# Patient Record
Sex: Female | Born: 1935 | Hispanic: Refuse to answer | Smoking: Never smoker
Health system: Southern US, Community
[De-identification: ages and names within clinical notes are randomized; demographics above are authoritative.]

## PROBLEM LIST (undated history)

## (undated) DIAGNOSIS — F0390 Unspecified dementia without behavioral disturbance: Secondary | ICD-10-CM

## (undated) DIAGNOSIS — S42301A Unspecified fracture of shaft of humerus, right arm, initial encounter for closed fracture: Secondary | ICD-10-CM

## (undated) DIAGNOSIS — Z8781 Personal history of (healed) traumatic fracture: Secondary | ICD-10-CM

## (undated) DIAGNOSIS — J45909 Unspecified asthma, uncomplicated: Secondary | ICD-10-CM

## (undated) DIAGNOSIS — M2031 Hallux varus (acquired), right foot: Secondary | ICD-10-CM

## (undated) DIAGNOSIS — F03A0 Unspecified dementia, mild, without behavioral disturbance, psychotic disturbance, mood disturbance, and anxiety: Secondary | ICD-10-CM

## (undated) DIAGNOSIS — W19XXXA Unspecified fall, initial encounter: Secondary | ICD-10-CM

## (undated) DIAGNOSIS — M4856XA Collapsed vertebra, not elsewhere classified, lumbar region, initial encounter for fracture: Secondary | ICD-10-CM

## (undated) DIAGNOSIS — N189 Chronic kidney disease, unspecified: Secondary | ICD-10-CM

## (undated) DIAGNOSIS — E119 Type 2 diabetes mellitus without complications: Secondary | ICD-10-CM

## (undated) DIAGNOSIS — L97511 Non-pressure chronic ulcer of other part of right foot limited to breakdown of skin: Secondary | ICD-10-CM

## (undated) DIAGNOSIS — R296 Repeated falls: Secondary | ICD-10-CM

## (undated) DIAGNOSIS — E1169 Type 2 diabetes mellitus with other specified complication: Secondary | ICD-10-CM

## (undated) DIAGNOSIS — B351 Tinea unguium: Secondary | ICD-10-CM

## (undated) HISTORY — PX: EYE SURGERY: SHX253

## (undated) HISTORY — PX: BREAST SURGERY: SHX581

## (undated) HISTORY — PX: OTHER SURGICAL HISTORY: SHX169

## (undated) HISTORY — PX: PANCREATIC CYST DRAINAGE: SHX2156

---

## 2020-11-26 ENCOUNTER — Emergency Department: Payer: Medicare Other

## 2020-11-26 ENCOUNTER — Emergency Department
Admission: EM | Admit: 2020-11-26 | Discharge: 2020-11-26 | Disposition: A | Discharge status: Home / Self Care | Payer: Medicare Other | Attending: Emergency Medicine | Admitting: Emergency Medicine

## 2020-11-26 ENCOUNTER — Other Ambulatory Visit: Payer: Self-pay

## 2020-11-26 DIAGNOSIS — W19XXXA Unspecified fall, initial encounter: Secondary | ICD-10-CM | POA: Diagnosis not present

## 2020-11-26 DIAGNOSIS — F0390 Unspecified dementia without behavioral disturbance: Secondary | ICD-10-CM | POA: Diagnosis not present

## 2020-11-26 DIAGNOSIS — Y92129 Unspecified place in nursing home as the place of occurrence of the external cause: Secondary | ICD-10-CM | POA: Insufficient documentation

## 2020-11-26 DIAGNOSIS — S3992XA Unspecified injury of lower back, initial encounter: Secondary | ICD-10-CM | POA: Diagnosis present

## 2020-11-26 DIAGNOSIS — S32040A Wedge compression fracture of fourth lumbar vertebra, initial encounter for closed fracture: Secondary | ICD-10-CM | POA: Diagnosis not present

## 2020-11-26 LAB — URINALYSIS, COMPLETE (UACMP) WITH MICROSCOPIC
Bilirubin Urine: NEGATIVE
Glucose, UA: NEGATIVE mg/dL
Hgb urine dipstick: NEGATIVE
Ketones, ur: NEGATIVE mg/dL
Nitrite: POSITIVE — AB
Protein, ur: NEGATIVE mg/dL
Specific Gravity, Urine: 1.014 (ref 1.005–1.030)
pH: 5 (ref 5.0–8.0)

## 2020-11-26 MED ORDER — OXYCODONE-ACETAMINOPHEN 5-325 MG PO TABS: 1.0000 | ORAL_TABLET | Freq: Four times a day (QID) | ORAL | 0 refills | Status: DC | PRN

## 2020-11-26 MED ORDER — PREDNISONE 50 MG PO TABS: 50.0000 mg | ORAL_TABLET | Freq: Every day | ORAL | 0 refills | Status: DC

## 2020-11-26 MED ORDER — OXYCODONE-ACETAMINOPHEN 5-325 MG PO TABS
1.0000 | ORAL_TABLET | Freq: Once | ORAL | Status: AC
  Administered 2020-11-26: 20:00:00 1 via ORAL
  Filled 2020-11-26: qty 1

## 2020-11-26 NOTE — ED Triage Notes (Signed)
Pt comes with lower back pain off and on for a few days. Lives at moderately independent living at [facility]. Staff unsure if any falls. Pt denies falls. Hx of pelvic fracture.

## 2020-11-26 NOTE — ED Provider Notes (Signed)
[facility] Emergency Department Provider Note  ____________________________________________  Time seen: Approximately 7:14 PM  I have reviewed the triage vital signs and the nursing notes.   HISTORY  Chief Complaint Back Pain    HPI Linda Yu is a 86 y.o. female who presents the emergency department with her daughter for complaint of nontraumatic hip/flank/back pain.  Patient has been complaining of symptoms for at least 3 days.  The daughter reports that the patient does have a history of dementia, stays in a long-term care facility in the independent section.  Patient has difficulty ambulating without help due to her pain.  She denied any dysuria, polyuria or hematuria.  The daughter denies any reported fevers, URI symptoms.  No reported trauma, however the daughter states with her dementia she definitely could have fallen, got herself out of the floor and nobody would her know that she had fallen.  A similar episode earlier this year had occurred when she complained of hip pain and was evaluated and had a pelvic fracture.  Patient is still able to ambulate with assistance at this time with no discernible limp.  No other appreciable complaints currently.         History reviewed. No pertinent past medical history.  There are no problems to display for this patient.     Prior to Admission medications   Medication Sig Start Date End Date Taking? Authorizing Provider  oxyCODONE-acetaminophen (PERCOCET/ROXICET) 5-325 MG tablet Take 1 tablet by mouth every 6 (six) hours as needed for severe pain. 11/26/20  Yes Adilee Lemme, Delorise Royals, PA-C  predniSONE (DELTASONE) 50 MG tablet Take 1 tablet (50 mg total) by mouth daily with breakfast. 11/26/20  Yes Margean Korell, Delorise Royals, PA-C    Allergies Patient has no known allergies.  History reviewed. No pertinent family history.  Social History     Review of Systems  Constitutional: No fever/chills Eyes: No  visual changes. No discharge ENT: No upper respiratory complaints. Cardiovascular: no chest pain. Respiratory: no cough. No SOB. Gastrointestinal: No abdominal pain.  No nausea, no vomiting.  No diarrhea.  No constipation. Genitourinary: Negative for dysuria. No hematuria Musculoskeletal: Positive for lower back/flank/hip pain, primarily on the right Skin: Negative for rash, abrasions, lacerations, ecchymosis. Neurological: Negative for headaches, focal weakness or numbness.  10 System ROS otherwise negative.  ____________________________________________   PHYSICAL EXAM:  VITAL SIGNS: ED Triage Vitals [11/26/20 1647]  Enc Vitals Group     BP (!) 149/81     Pulse Rate 92     Resp 18     Temp 99.1 F (37.3 C)     Temp Source Oral     SpO2 97 %     Weight 128 lb (58.1 kg)     Height 5\' 2"  (1.575 m)     Head Circumference      Peak Flow      Pain Score 5     Pain Loc      Pain Edu?      Excl. in GC?      Constitutional: Alert and oriented. Well appearing and in no acute distress. Eyes: Conjunctivae are normal. PERRL. EOMI. Head: Atraumatic. ENT:      Ears:       Nose: No congestion/rhinnorhea.      Mouth/Throat: Mucous membranes are moist.  Neck: No stridor.    Cardiovascular: Normal rate, regular rhythm. Normal S1 and S2.  Good peripheral circulation. Respiratory: Normal respiratory effort without tachypnea or retractions. Lungs CTAB. Good  air entry to the bases with no decreased or absent breath sounds. Gastrointestinal: Bowel sounds 4 quadrants. Soft and nontender to palpation. No guarding or rigidity. No palpable masses. No distention. No CVA tenderness. Musculoskeletal: Full range of motion to all extremities. No gross deformities appreciated.  Visualization of the lumbar spine reveals no visible signs of trauma with abrasions, ecchymosis, edema, lacerations.  Patient is tender to palpation along L3-L5 with extension into the SI joint.  Mild extension into the  sciatic notch right side but no extension into the lateral or anterior hip and pelvis.  No significant tenderness on the left SI joint or left pelvis.  Patient is able to move both lower extremities appropriately, stand at this time.  There is no tenderness over the extremities.  No visible or palpable signs of trauma to the lower extremities.  Dorsalis pedis pulses sensation intact and equal bilateral lower extremities.  On reentering the room, patient's daughter asked me to evaluate the patient's right foot.  Patient has a diabetic foot ulcer that is to be seen next week by podiatry.  Daughter wanted to ensure no infection.  There is a edematous lesion along the medial aspect of the second toe of the right foot.  No surrounding erythema or edema.  No purulent drainage.  Area is tender to palpation.  Consistent with foot ulcer without signs of infection.  No streaking, erythema along the foot. Neurologic:  Normal speech and language. No gross focal neurologic deficits are appreciated.  Skin:  Skin is warm, dry and intact. No rash noted. Psychiatric: Mood and affect are normal. Speech and behavior are normal. Patient exhibits appropriate insight and judgement.   ____________________________________________   LABS (all labs ordered are listed, but only abnormal results are displayed)  Labs Reviewed  URINALYSIS, COMPLETE (UACMP) WITH MICROSCOPIC - Abnormal; Notable for the following components:      Result Value   Color, Urine YELLOW (*)    APPearance CLOUDY (*)    Nitrite POSITIVE (*)    Leukocytes,Ua SMALL (*)    Bacteria, UA MANY (*)    All other components within normal limits   ____________________________________________  EKG   ____________________________________________  RADIOLOGY I personally viewed and evaluated these images as part of my medical decision making, as well as reviewing the written report by the radiologist.  ED Provider Interpretation: I concur with radiologist  finding of endplate deformity of L4 possibly L2 and 3 as well consistent with compression fracture.  Less than a third height of the vertebrae.  No other significant signs of traumatic injury in the lumbar, pelvis or hips on imaging.  All fractures of the superior and inferior rami on the left side are appreciated.  DG Lumbar Spine 2-3 Views  Result Date: 11/26/2020 CLINICAL DATA:  Nontraumatic back and pelvic pain. EXAM: LUMBAR SPINE - 2-3 VIEW COMPARISON:  None. FINDINGS: Mild thoracolumbar curvature convex to the left. Transitional anatomy with L5 being sacralized. Superior endplate deformity at L4 consistent with a compression fracture of indeterminate age, possibly recent. There could also be minor inferior endplate deformity at L2 and superior endplate deformity at L3. There is lower lumbar degenerative disc disease and degenerative facet disease, including anterolisthesis at L4-5 of about 5 mm. There is extensive arterial vascular calcification. Calcifications in the right mid abdomen could be venous, gallstones or renal calculi. IMPRESSION: 1. Transitional anatomy with sacralization of L5. 2. Superior endplate deformity at L4 consistent with a compression fracture of indeterminate age, possibly recent. Some possibility  also minor endplate fractures at L2 and L3. 3. Lower lumbar degenerative disc disease and degenerative facet disease that could be associated with pain. 4. Calcifications in the right mid abdomen could be venous, gallstones or renal calculi. Electronically Signed   By: Paulina FusiMark  Shogry M.D.   On: 11/26/2020 20:02   DG Hip Unilat W or Wo Pelvis 2-3 Views Left  Result Date: 11/26/2020 CLINICAL DATA:  Back pain and hip pain EXAM: DG HIP (WITH OR WITHOUT PELVIS) 2-3V LEFT COMPARISON:  None. FINDINGS: No acute fracture. Old healed superior and inferior rami fractures on the left. 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 IMPRESSION: No acute finding. Old healed superior  and inferior rami fractures on the left. Osteitis pubis and sacroiliac osteoarthritis. Electronically Signed   By: Paulina FusiMark  Shogry M.D.   On: 11/26/2020 20:04   DG Hip Unilat W or Wo Pelvis 2-3 Views Right  Result Date: 11/26/2020 CLINICAL DATA:  Intermittent low back pain over the last few days. Hip discomfort. EXAM: DG HIP (WITH OR WITHOUT PELVIS) 2-3V RIGHT COMPARISON:  None. FINDINGS: No evidence of pelvic or right hip fracture. Ordinary chronic changes of osteitis pubis. Mild sacroiliac osteoarthritis. IMPRESSION: No acute finding. 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 Electronically Signed   By: Paulina FusiMark  Shogry M.D.   On: 11/26/2020 20:03    ____________________________________________    PROCEDURES  Procedure(s) performed:    Procedures    Medications  oxyCODONE-acetaminophen (PERCOCET/ROXICET) 5-325 MG per tablet 1 tablet (1 tablet Oral Given 11/26/20 2022)     ____________________________________________   INITIAL IMPRESSION / ASSESSMENT AND PLAN / ED COURSE  Pertinent labs & imaging results that were available during my care of the patient were reviewed by me and considered in my medical decision making (see chart for details).  Review of the [place] CSRS was performed in accordance of the [facility] prior to dispensing any controlled drugs.           Patient's diagnosis is consistent with compression fracture lumbar spine.  Patient presented to the emergency department with low back pain.  History of dementia so unsure whether the patient may have had a fall.  There was no reported fall from the patient's long-term care facility.  Patient was tender over the L4 and L5 region extending into the SI joint.  Given poor historian status imaging was obtained to assess for fracture.  There is findings consistent with compression fracture of the L4 vertebrae.  This does not extend past the third of the vertebral height.  No neuro symptoms distally to warrant further  imaging.  No other significant signs of trauma on imaging.  Patient has an incidental finding of diabetic foot ulcer to the right foot with no surrounding signs of infection.  Patient is already scheduled to see podiatry for same.. Patient will be discharged home with prescriptions for Percocet and prednisone. Patient's family will control the patient's narcotic to ensure that she takes it appropriately and has somebody with her if she takes a narcotic pain medicine.. Patient is to follow up with primary care neurosurgery as needed or otherwise directed. Patient is given ED precautions to return to the ED for any worsening or new symptoms.     ____________________________________________  FINAL CLINICAL IMPRESSION(S) / ED DIAGNOSES  Final diagnoses:  Closed compression fracture of L4 lumbar vertebra, initial encounter (HCC)      NEW MEDICATIONS STARTED DURING THIS VISIT:  ED Discharge Orders  Ordered    oxyCODONE-acetaminophen (PERCOCET/ROXICET) 5-325 MG tablet  Every 6 hours PRN        11/26/20 2109    predniSONE (DELTASONE) 50 MG tablet  Daily with breakfast        11/26/20 2109              This chart was dictated using voice recognition software/Dragon. Despite best efforts to proofread, errors can occur which can change the meaning. Any change was purely unintentional.    Lanette Hampshire 11/26/20 2110    Phineas Semen, MD 11/26/20 2146

## 2020-11-26 NOTE — Discharge Instructions (Signed)
Patient may receive 650 mg of Tylenol every 4 hours as needed for pain. Take daily steroid. Narcotic pain medication will be administered by family as needed for severe pain.

## 2021-01-17 ENCOUNTER — Other Ambulatory Visit: Payer: Self-pay | Admitting: Podiatry

## 2021-01-23 ENCOUNTER — Other Ambulatory Visit
Admission: RE | Admit: 2021-01-23 | Discharge: 2021-01-23 | Disposition: A | Discharge status: Home / Self Care | Payer: Medicare Other | Source: Ambulatory Visit | Attending: Surgery | Admitting: Surgery

## 2021-01-23 ENCOUNTER — Other Ambulatory Visit: Payer: Self-pay

## 2021-01-23 ENCOUNTER — Other Ambulatory Visit
Admission: RE | Admit: 2021-01-23 | Discharge: 2021-01-23 | Disposition: A | Discharge status: Home / Self Care | Payer: Medicare Other | Source: Ambulatory Visit | Attending: Podiatry | Admitting: Podiatry

## 2021-01-23 DIAGNOSIS — Z01812 Encounter for preprocedural laboratory examination: Secondary | ICD-10-CM | POA: Diagnosis present

## 2021-01-23 DIAGNOSIS — Z20822 Contact with and (suspected) exposure to covid-19: Secondary | ICD-10-CM | POA: Diagnosis not present

## 2021-01-23 HISTORY — DX: Unspecified dementia, mild, without behavioral disturbance, psychotic disturbance, mood disturbance, and anxiety: F03.A0

## 2021-01-23 HISTORY — DX: Chronic kidney disease, unspecified: N18.9

## 2021-01-23 HISTORY — DX: Hallux varus (acquired), right foot: M20.31

## 2021-01-23 HISTORY — DX: Personal history of (healed) traumatic fracture: Z87.81

## 2021-01-23 HISTORY — DX: Repeated falls: R29.6

## 2021-01-23 HISTORY — DX: Unspecified fracture of shaft of humerus, right arm, initial encounter for closed fracture: S42.301A

## 2021-01-23 HISTORY — DX: Non-pressure chronic ulcer of other part of right foot limited to breakdown of skin: L97.511

## 2021-01-23 HISTORY — DX: Collapsed vertebra, not elsewhere classified, lumbar region, initial encounter for fracture: M48.56XA

## 2021-01-23 HISTORY — DX: Tinea unguium: B35.1

## 2021-01-23 HISTORY — DX: Type 2 diabetes mellitus without complications: E11.9

## 2021-01-23 HISTORY — DX: Unspecified asthma, uncomplicated: J45.909

## 2021-01-23 HISTORY — DX: Type 2 diabetes mellitus with other specified complication: E11.69

## 2021-01-23 HISTORY — DX: Unspecified dementia without behavioral disturbance: F03.90

## 2021-01-23 HISTORY — DX: Unspecified fall, initial encounter: W19.XXXA

## 2021-01-23 LAB — SARS CORONAVIRUS 2 (TAT 6-24 HRS): SARS Coronavirus 2: NEGATIVE

## 2021-01-23 NOTE — Patient Instructions (Addendum)
Your procedure is scheduled on: 01/24/2021 Report to the Registration Desk on the 1st floor of the [facility]. To find out your arrival time, please call [phone] between 1PM - 3PM on: 01/23/21  REMEMBER: Instructions that are not followed completely may result in serious medical risk, up to and including death; or upon the discretion of your surgeon and anesthesiologist your surgery may need to be rescheduled.  Do not eat food after midnight the night before surgery.  No gum chewing, lozengers or hard candies.  You may however, drink CLEAR liquids up to 3 hours before you are scheduled to arrive for your surgery. Do not drink anything within 3 hours of your scheduled arrival time, Type 1 and Type 2 diabetics should only drink water.  TAKE THESE MEDICATIONS THE MORNING OF SURGERY WITH A SIP OF WATER:  - albuterol (PROVENTIL) (2.5 MG/3ML) 0.083% nebulizer solution - Fluticasone-Salmeterol (ADVAIR) 100-50 MCG/DOSE AEPB - montelukast (SINGULAIR) 10 MG tablet  Stop Metformin 2 days prior to surgery. Do not take 2/24, or the morning of surgery.  Take 1/2 of usual insulin glargine (LANTUS) 100 UNIT/ML injection dose the night before surgery   Follow recommendations from Cardiologist, Pulmonologist or PCP regarding stopping Aspirin, Coumadin, Plavix, Eliquis, Pradaxa, or Pletal.  One week prior to surgery: Stop Anti-inflammatories (NSAIDS) such as Advil, Aleve, Ibuprofen, Motrin, Naproxen, Naprosyn and Aspirin based products such as Excedrin, Goodys Powder, BC Powder. Stop ANY OVER THE COUNTER supplements until after surgery.  No Alcohol for 24 hours before or after surgery.  No Smoking including e-cigarettes for 24 hours prior to surgery.  No chewable tobacco products for at least 6 hours prior to surgery.  No nicotine patches on the day of surgery.  Do not use any "recreational" drugs for at least a week prior to your surgery.  Please be advised that the combination of cocaine  and anesthesia may have negative outcomes, up to and including death. If you test positive for cocaine, your surgery will be cancelled.  On the morning of surgery brush your teeth with toothpaste and water, you may rinse your mouth with mouthwash if you wish. Do not swallow any toothpaste or mouthwash.  Do not wear jewelry, make-up, hairpins, clips or nail polish.  Do not wear lotions, powders, or perfumes.   Do not shave body from the neck down 48 hours prior to surgery just in case you cut yourself which could leave a site for infection.  Also, freshly shaved skin may become irritated if using the CHG soap.  Contact lenses, hearing aids and dentures may not be worn into surgery.  Do not bring valuables to the hospital. [facility] is not responsible for any missing/lost belongings or valuables.   Notify your doctor if there is any change in your medical condition (cold, fever, infection).  You must have a competent adult to stay with you for the 1st 24 hrs after procedure.  Wear comfortable clothing (specific to your surgery type) to the hospital.  Plan for stool softeners for home use; pain medications have a tendency to cause constipation. You can also help prevent constipation by eating foods high in fiber such as fruits and vegetables and drinking plenty of fluids as your diet allows.  After surgery, you can help prevent lung complications by doing breathing exercises.  Take deep breaths and cough every 1-2 hours. Your doctor may order a device called an Incentive Spirometer to help you take deep breaths. When coughing or sneezing, hold a pillow  firmly against your incision with both hands. This is called "splinting." Doing this helps protect your incision. It also decreases belly discomfort.  If you are being admitted to the hospital overnight, leave your suitcase in the car. After surgery it may be brought to your room.  If you are being discharged the day of surgery, you  will not be allowed to drive home. You will need a responsible adult (18 years or older) to drive you home and stay with you that night.   If you are taking public transportation, you will need to have a responsible adult (18 years or older) with you. Please confirm with your physician that it is acceptable to use public transportation.   Please call the Pre-admissions Testing Dept. at [phone] if you have any questions about these instructions.  Visitation Policy:  Patients undergoing a surgery or procedure may have one family member or support person with them as long as that person is not COVID-19 positive or experiencing its symptoms.  That person may remain in the waiting area during the procedure.  Inpatient Visitation:    Visiting hours are 7 a.m. to 8 p.m. Patients will be allowed one visitor. The visitor may change daily. The visitor must pass COVID-19 screenings, use hand sanitizer when entering and exiting the patient's room and wear a mask at all times, including in the patient's room. Patients must also wear a mask when staff or their visitor are in the room. Masking is required regardless of vaccination status. Systemwide, no visitors 17 or younger.  Visitation Policy Changes: The following changes are to take effect on Feb. 28 at 7 a.m.  No visitors under the age of 59. Any visitor under the age of 63 must be accompanied by an adult. Adult inpatients: Two visitors will be allowed daily and the visitors may change each day during the patient's stay. (Please note -- no changes at this time for the [facility], Children's Emergency Department and inpatients, Emergency Departments, Ambulatory Sites, [facility], medical practices and procedural areas.)

## 2021-01-23 NOTE — Pre-Procedure Instructions (Signed)
Interview and instructions completed with Irene Shipper , legal guardian of patient, Phillips Odor will bring legal guardianship papers to hospital in the morning before surgery.

## 2021-01-24 ENCOUNTER — Other Ambulatory Visit: Payer: Self-pay

## 2021-01-24 ENCOUNTER — Encounter: Payer: Self-pay | Admitting: Podiatry

## 2021-01-24 ENCOUNTER — Ambulatory Visit: Payer: Medicare Other | Admitting: Urgent Care

## 2021-01-24 ENCOUNTER — Ambulatory Visit: Payer: Medicare Other

## 2021-01-24 ENCOUNTER — Ambulatory Visit: Payer: Medicare Other | Admitting: Anesthesiology

## 2021-01-24 ENCOUNTER — Encounter
Admission: RE | Disposition: A | Discharge status: Home / Self Care | Payer: Self-pay | Source: Home / Self Care | Attending: Podiatry

## 2021-01-24 ENCOUNTER — Ambulatory Visit
Admission: RE | Admit: 2021-01-24 | Discharge: 2021-01-24 | Disposition: A | Discharge status: Home / Self Care | Payer: Medicare Other | Attending: Podiatry | Admitting: Podiatry

## 2021-01-24 DIAGNOSIS — E11621 Type 2 diabetes mellitus with foot ulcer: Secondary | ICD-10-CM | POA: Diagnosis not present

## 2021-01-24 DIAGNOSIS — Z7984 Long term (current) use of oral hypoglycemic drugs: Secondary | ICD-10-CM | POA: Insufficient documentation

## 2021-01-24 DIAGNOSIS — B351 Tinea unguium: Secondary | ICD-10-CM | POA: Insufficient documentation

## 2021-01-24 DIAGNOSIS — M2041 Other hammer toe(s) (acquired), right foot: Secondary | ICD-10-CM | POA: Diagnosis present

## 2021-01-24 DIAGNOSIS — M899 Disorder of bone, unspecified: Secondary | ICD-10-CM | POA: Insufficient documentation

## 2021-01-24 DIAGNOSIS — Z794 Long term (current) use of insulin: Secondary | ICD-10-CM | POA: Insufficient documentation

## 2021-01-24 DIAGNOSIS — L97511 Non-pressure chronic ulcer of other part of right foot limited to breakdown of skin: Secondary | ICD-10-CM | POA: Diagnosis not present

## 2021-01-24 HISTORY — PX: HAMMER TOE SURGERY: SHX385

## 2021-01-24 HISTORY — PX: BONE EXCISION: SHX6730

## 2021-01-24 HISTORY — PX: DIGIT NAIL REMOVAL: SHX5052

## 2021-01-24 LAB — COMPREHENSIVE METABOLIC PANEL
ALT: 16 U/L (ref 0–44)
AST: 21 U/L (ref 15–41)
Albumin: 3.8 g/dL (ref 3.5–5.0)
Alkaline Phosphatase: 96 U/L (ref 38–126)
Anion gap: 7 (ref 5–15)
BUN: 12 mg/dL (ref 8–23)
CO2: 27 mmol/L (ref 22–32)
Calcium: 9.1 mg/dL (ref 8.9–10.3)
Chloride: 106 mmol/L (ref 98–111)
Creatinine, Ser: 0.55 mg/dL (ref 0.44–1.00)
GFR, Estimated: >60 mL/min (ref >60)
Glucose, Bld: 149 mg/dL — ABNORMAL HIGH (ref 70–99)
Potassium: 4.1 mmol/L (ref 3.5–5.1)
Sodium: 140 mmol/L (ref 135–145)
Total Bilirubin: 0.6 mg/dL (ref 0.3–1.2)
Total Protein: 7.3 g/dL (ref 6.5–8.1)

## 2021-01-24 LAB — GLUCOSE, CAPILLARY
Glucose-Capillary: 146 mg/dL — ABNORMAL HIGH (ref 70–99)
Glucose-Capillary: 147 mg/dL — ABNORMAL HIGH (ref 70–99)

## 2021-01-24 SURGERY — BONE EXCISION
Anesthesia: Monitor Anesthesia Care | Site: Second Toe | Laterality: Right

## 2021-01-24 MED ORDER — LIDOCAINE HCL (PF) 1 % IJ SOLN
INTRAMUSCULAR | Status: DC | PRN
  Administered 2021-01-24: 5 mL

## 2021-01-24 MED ORDER — POVIDONE-IODINE 7.5 % EX SOLN
Freq: Once | CUTANEOUS | Status: AC
  Filled 2021-01-24: qty 118

## 2021-01-24 MED ORDER — ONDANSETRON HCL 4 MG PO TABS: 4.0000 mg | ORAL_TABLET | Freq: Four times a day (QID) | ORAL | Status: DC | PRN

## 2021-01-24 MED ORDER — DEXMEDETOMIDINE (PRECEDEX) IN NS 20 MCG/5ML (4 MCG/ML) IV SYRINGE
PREFILLED_SYRINGE | INTRAVENOUS | Status: DC | PRN
  Administered 2021-01-24: 4 ug via INTRAVENOUS

## 2021-01-24 MED ORDER — PROPOFOL 500 MG/50ML IV EMUL
INTRAVENOUS | Status: DC | PRN
  Administered 2021-01-24: 25 ug/kg/min via INTRAVENOUS
  Administered 2021-01-24: 35 ug/kg/min via INTRAVENOUS

## 2021-01-24 MED ORDER — CHLORHEXIDINE GLUCONATE 0.12 % MT SOLN: 15.0000 mL | Freq: Once | OROMUCOSAL | Status: AC

## 2021-01-24 MED ORDER — ONDANSETRON HCL 4 MG/2ML IJ SOLN: 4.0000 mg | Freq: Once | INTRAMUSCULAR | Status: DC | PRN

## 2021-01-24 MED ORDER — FENTANYL CITRATE (PF) 100 MCG/2ML IJ SOLN
INTRAMUSCULAR | Status: AC
  Filled 2021-01-24: qty 2

## 2021-01-24 MED ORDER — HYDROCODONE-ACETAMINOPHEN 5-325 MG PO TABS: 1.0000 | ORAL_TABLET | Freq: Four times a day (QID) | ORAL | 0 refills | Status: AC | PRN

## 2021-01-24 MED ORDER — METOCLOPRAMIDE HCL 5 MG/ML IJ SOLN: 5.0000 mg | Freq: Three times a day (TID) | INTRAMUSCULAR | Status: DC | PRN

## 2021-01-24 MED ORDER — BUPIVACAINE HCL (PF) 0.25 % IJ SOLN
INTRAMUSCULAR | Status: AC
  Filled 2021-01-24: qty 30

## 2021-01-24 MED ORDER — PROPOFOL 10 MG/ML IV BOLUS
INTRAVENOUS | Status: AC
  Filled 2021-01-24: qty 40

## 2021-01-24 MED ORDER — DEXMEDETOMIDINE (PRECEDEX) IN NS 20 MCG/5ML (4 MCG/ML) IV SYRINGE
PREFILLED_SYRINGE | INTRAVENOUS | Status: AC
  Filled 2021-01-24: qty 5

## 2021-01-24 MED ORDER — CEFAZOLIN SODIUM-DEXTROSE 2-4 GM/100ML-% IV SOLN
2.0000 g | INTRAVENOUS | Status: AC
  Administered 2021-01-24 (×2): 2 g via INTRAVENOUS

## 2021-01-24 MED ORDER — FENTANYL CITRATE (PF) 100 MCG/2ML IJ SOLN
25.0000 ug | INTRAMUSCULAR | Status: DC | PRN
Start: 2021-01-24 — End: 2021-01-24

## 2021-01-24 MED ORDER — BUPIVACAINE HCL (PF) 0.5 % IJ SOLN
INTRAMUSCULAR | Status: AC
  Filled 2021-01-24: qty 30

## 2021-01-24 MED ORDER — FENTANYL CITRATE (PF) 100 MCG/2ML IJ SOLN
INTRAMUSCULAR | Status: DC | PRN
  Administered 2021-01-24: 50 ug via INTRAVENOUS

## 2021-01-24 MED ORDER — CHLORHEXIDINE GLUCONATE 0.12 % MT SOLN
OROMUCOSAL | Status: AC
  Administered 2021-01-24: 15 mL via OROMUCOSAL
  Filled 2021-01-24: qty 15

## 2021-01-24 MED ORDER — ONDANSETRON HCL 4 MG/2ML IJ SOLN: 4.0000 mg | Freq: Four times a day (QID) | INTRAMUSCULAR | Status: DC | PRN

## 2021-01-24 MED ORDER — ORAL CARE MOUTH RINSE: 15.0000 mL | Freq: Once | OROMUCOSAL | Status: AC

## 2021-01-24 MED ORDER — METOCLOPRAMIDE HCL 10 MG PO TABS: 5.0000 mg | ORAL_TABLET | Freq: Three times a day (TID) | ORAL | Status: DC | PRN

## 2021-01-24 MED ORDER — BUPIVACAINE HCL (PF) 0.5 % IJ SOLN
INTRAMUSCULAR | Status: DC | PRN
  Administered 2021-01-24: 5 mL
  Administered 2021-01-24: 10 mL

## 2021-01-24 MED ORDER — SODIUM CHLORIDE 0.9 % IV SOLN: INTRAVENOUS | Status: DC

## 2021-01-24 MED ORDER — CEFAZOLIN SODIUM-DEXTROSE 2-4 GM/100ML-% IV SOLN
INTRAVENOUS | Status: AC
  Filled 2021-01-24: qty 100

## 2021-01-24 MED ORDER — MEPERIDINE HCL 50 MG/ML IJ SOLN: 6.2500 mg | INTRAMUSCULAR | Status: DC | PRN

## 2021-01-24 MED ORDER — PROPOFOL 10 MG/ML IV BOLUS
INTRAVENOUS | Status: AC
  Filled 2021-01-24: qty 20

## 2021-01-24 SURGICAL SUPPLY — 54 items
APL SKNCLS STERI-STRIP NONHPOA (GAUZE/BANDAGES/DRESSINGS) ×2
BENZOIN TINCTURE PRP APPL 2/3 (GAUZE/BANDAGES/DRESSINGS) ×3 IMPLANT
BLADE MED AGGRESSIVE (BLADE) IMPLANT
BLADE OSC/SAGITTAL MD 5.5X18 (BLADE) IMPLANT
BLADE SURG 15 STRL LF DISP TIS (BLADE) IMPLANT
BLADE SURG 15 STRL SS (BLADE)
BNDG CMPR 75X41 PLY HI ABS (GAUZE/BANDAGES/DRESSINGS) ×2
BNDG CMPR STD VLCR NS LF 5.8X4 (GAUZE/BANDAGES/DRESSINGS) ×2
BNDG COHESIVE 4X5 TAN STRL (GAUZE/BANDAGES/DRESSINGS) ×3 IMPLANT
BNDG ELASTIC 4X5.8 VLCR NS LF (GAUZE/BANDAGES/DRESSINGS) ×3 IMPLANT
BNDG ESMARK 4X12 TAN STRL LF (GAUZE/BANDAGES/DRESSINGS) ×3 IMPLANT
BNDG GAUZE 4.5X4.1 6PLY STRL (MISCELLANEOUS) ×3 IMPLANT
BNDG STRETCH 4X75 STRL LF (GAUZE/BANDAGES/DRESSINGS) ×3 IMPLANT
CANISTER SUCT 1200ML W/VALVE (MISCELLANEOUS) ×3 IMPLANT
COVER LIGHT HANDLE STERIS (MISCELLANEOUS) ×6 IMPLANT
COVER WAND RF STERILE (DRAPES) ×3 IMPLANT
CUFF TOURN SGL QUICK 12 (TOURNIQUET CUFF) ×1 IMPLANT
CUFF TOURN SGL QUICK 18X4 (TOURNIQUET CUFF) ×2 IMPLANT
DRAPE FLUOR MINI C-ARM 54X84 (DRAPES) ×3 IMPLANT
DURAPREP 26ML APPLICATOR (WOUND CARE) ×3 IMPLANT
ELECT REM PT RETURN 9FT ADLT (ELECTROSURGICAL) ×3
ELECTRODE REM PT RTRN 9FT ADLT (ELECTROSURGICAL) ×2 IMPLANT
GAUZE SPONGE 4X4 12PLY STRL (GAUZE/BANDAGES/DRESSINGS) ×3 IMPLANT
GAUZE XEROFORM 1X8 LF (GAUZE/BANDAGES/DRESSINGS) ×3 IMPLANT
GLOVE INDICATOR 8.0 STRL GRN (GLOVE) ×3 IMPLANT
GLOVE SURG ENC MOIS LTX SZ7.5 (GLOVE) ×3 IMPLANT
GOWN STRL REUS W/ TWL XL LVL3 (GOWN DISPOSABLE) ×4 IMPLANT
GOWN STRL REUS W/TWL XL LVL3 (GOWN DISPOSABLE) ×6
K-WIRE DBL END TROCAR 6X.045 (WIRE)
K-WIRE DBL END TROCAR 6X.062 (WIRE)
KIT TURNOVER KIT A (KITS) ×3 IMPLANT
KWIRE DBL END TROCAR 6X.045 (WIRE) IMPLANT
KWIRE DBL END TROCAR 6X.062 (WIRE) IMPLANT
MANIFOLD NEPTUNE II (INSTRUMENTS) ×3 IMPLANT
NEEDLE HYPO 22GX1.5 SAFETY (NEEDLE) ×3 IMPLANT
NS IRRIG 500ML POUR BTL (IV SOLUTION) ×3 IMPLANT
PACK EXTREMITY ARMC (MISCELLANEOUS) ×3 IMPLANT
PIN BALLS 3/8 F/.045 WIRE (MISCELLANEOUS) ×3 IMPLANT
RASP SM TEAR CROSS CUT (RASP) ×1 IMPLANT
STOCKINETTE IMPERV 14X48 (MISCELLANEOUS) ×3 IMPLANT
STRAP SAFETY 5IN WIDE (MISCELLANEOUS) ×3 IMPLANT
STRIP CLOSURE SKIN 1/4X4 (GAUZE/BANDAGES/DRESSINGS) ×3 IMPLANT
SUT ETHILON 4-0 (SUTURE)
SUT ETHILON 4-0 FS2 18XMFL BLK (SUTURE)
SUT ETHILON 5-0 FS-2 18 BLK (SUTURE) IMPLANT
SUT MNCRL 5-0+ PC-1 (SUTURE) IMPLANT
SUT MONOCRYL 5-0 (SUTURE)
SUT VIC AB 2-0 SH 27 (SUTURE)
SUT VIC AB 2-0 SH 27XBRD (SUTURE) IMPLANT
SUT VIC AB 3-0 SH 27 (SUTURE)
SUT VIC AB 3-0 SH 27X BRD (SUTURE) IMPLANT
SUT VIC AB 4-0 FS2 27 (SUTURE) ×1 IMPLANT
SUTURE ETHLN 4-0 FS2 18XMF BLK (SUTURE) IMPLANT
SYR 50ML LL SCALE MARK (SYRINGE) ×3 IMPLANT

## 2021-01-24 NOTE — Transfer of Care (Signed)
Immediate Anesthesia Transfer of Care Note  Patient: Linda Yu  Procedure(s) Performed: PARTIAL EXCISION BONE-PHALANX RIGHT GREAT TOE (Right First Toe) HAMMER TOE CORRECTION RIGHT 2ND TOE (Right Second Toe) AVULSION NAIL PLATE RIGHT T5  GREAT TOE (Right First Toe)  Patient Location: PACU  Anesthesia Type:General  Level of Consciousness: drowsy  Airway & Oxygen Therapy: Patient Spontanous Breathing  Post-op Assessment: Report given to RN and Post -op Vital signs reviewed and stable  Post vital signs: Reviewed and stable  Last Vitals:  Vitals Value Taken Time  BP 142/81 01/24/21 1122  Temp    Pulse 83 01/24/21 1125  Resp 24 01/24/21 1125  SpO2 99 % 01/24/21 1125  Vitals shown include unvalidated device data.  Last Pain:  Vitals:   01/24/21 0946  TempSrc: Temporal         Complications: No complications documented.

## 2021-01-24 NOTE — Discharge Instructions (Signed)
AMBULATORY SURGERY  °DISCHARGE INSTRUCTIONS ° ° °1) The drugs that you were given will stay in your system until tomorrow so for the next 24 hours you should not: ° °A) Drive an automobile °B) Make any legal decisions °C) Drink any alcoholic beverage ° ° °2) You may resume regular meals tomorrow.  Today it is better to start with liquids and gradually work up to solid foods. ° °You may eat anything you prefer, but it is better to start with liquids, then soup and crackers, and gradually work up to solid foods. ° ° °3) Please notify your doctor immediately if you have any unusual bleeding, trouble breathing, redness and pain at the surgery site, drainage, fever, or pain not relieved by medication. °4)  ° °5) Your post-operative visit with Dr.                     °           °     is: Date:                        Time:   ° °Please call to schedule your post-operative visit. ° °6) Additional Instructions: ° ° ° ° °[facility] °[facility] ° °POST OPERATIVE INSTRUCTIONS FOR DR. TROXLER, DR. FOWLER, AND DR. BAKER °[facility] ° ° °1. Take your medication as prescribed.  Pain medication should be taken only as needed. ° °2. Keep the dressing clean, dry and intact. ° °3. Keep your foot elevated above the heart level for the first 48 hours. ° °4. Walking to the bathroom and brief periods of walking are acceptable, unless we have instructed you to be non-weight bearing. ° °5. Always wear your post-op shoe when walking.  Always use your crutches if you are to be non-weight bearing. ° °6. Do not take a shower. Baths are permissible as long as the foot is kept out of the water.  ° °7. Every hour you are awake:  °- Bend your knee 15 times. °- Flex foot 15 times °- Massage calf 15 times ° °8. Call [facility] ([phone]) if any of the following problems occur: °- You develop a temperature or fever. °- The bandage becomes saturated with blood. °- Medication does not  stop your pain. °- Injury of the foot occurs. °- Any symptoms of infection including redness, odor, or red streaks running from wound. ° °

## 2021-01-24 NOTE — Op Note (Signed)
Operative note   Surgeon:Garvis Downum [employer] forces logistics/support/administrative officer: None    Preop diagnosis: 1 hammertoe right second toe 2.  Painful exostosis right great toe 3.  Painful mycotic nail with paronychia right great toenail    Postop diagnosis: Same    Procedure: 1.  PIPJ arthroplasty right second toe 2.  Exostectomy distal right great toe 3.  Nail avulsion right great toe    EBL: Minimal    Anesthesia:local and IV sedation    Hemostasis: Ankle tourniquet inflated to 200 mmHg for approximately 25 minutes    Specimen: None    Complications: None    Operative indications:Linda Yu is an 87 y.o. that presents today for surgical intervention.  The risks/benefits/alternatives/complications have been discussed and consent has been given.    Procedure:  Patient was brought into the OR and placed on the operating table in thesupine position. After anesthesia was obtained theright lower extremity was prepped and draped in usual sterile fashion.  Attention was initially directed to the distal lateral aspect of the right great toe where a longitudinal incision was over the interphalangeal joint just lateral to the long extensor tendon.  Sharp and blunt dissection carried down to the interphalangeal joint.  At this time a noted prominent exostosis was visualized on fluoroscopy.  With a power saw this was then removed and smoothed with a power rasp.  This was then flushed with copious amounts of irrigation  Attention was then directed to the second toe at the PIPJ level where a longitudinal incision was performed.  Sharp and blunt dissection carried down to the long extensor tendon.  A transverse incision was made in the extensor tendon was reflected.  The prominent PIPJ region was then excised with a power saw.  Good removal was noted under fluoroscopy.  All areas were flushed with copious amounts of irrigation with closure of the deeper tissue and tendinous tissue with a 4-0 Vicryl and the skin was closed with  a 4-0 nylon.  Attention was then directed to the distal aspect of the right great toe where the prominent inflamed nail was noted.  This was then bluntly removed.  The wound was flushed with copious amounts of irrigation.  A bandage was applied with Xeroform over the toe.  The remaining foot was then bandaged with a bulky sterile dressing.    Patient tolerated the procedure and anesthesia well.  Was transported from the OR to the PACU with all vital signs stable and vascular status intact. To be discharged per routine protocol.  Will follow up in approximately 1 week in the outpatient clinic.

## 2021-01-24 NOTE — Anesthesia Preprocedure Evaluation (Addendum)
Anesthesia Evaluation  Patient identified by MRN, date of birth, ID band Patient confused    Reviewed: Allergy & Precautions, H&P , NPO status , Patient's Chart, lab work & pertinent test results  Airway Mallampati: II  TM Distance: >3 FB Neck ROM: Full    Dental no notable dental hx.    Pulmonary asthma ,    Pulmonary exam normal        Cardiovascular negative cardio ROS Normal cardiovascular exam     Neuro/Psych PSYCHIATRIC DISORDERS Dementia negative neurological ROS     GI/Hepatic negative GI ROS, Neg liver ROS,   Endo/Other  negative endocrine ROSdiabetes  Renal/GU Renal disease  negative genitourinary   Musculoskeletal negative musculoskeletal ROS (+)   Abdominal   Peds negative pediatric ROS (+)  Hematology negative hematology ROS (+)   Anesthesia Other Findings Allergy  . Asthma, unspecified asthma severity, unspecified whether complicated, unspecified whether persistent  . Dementia (CMS-HCC)  . DM (diabetes mellitus), type 2 (CMS-HCC)     Reproductive/Obstetrics negative OB ROS                          Anesthesia Physical Anesthesia Plan  ASA: II  Anesthesia Plan: MAC   Post-op Pain Management:    Induction: Intravenous  PONV Risk Score and Plan: 2 and Propofol infusion  Airway Management Planned: Mask  Additional Equipment:   Intra-op Plan:   Post-operative Plan:   Informed Consent: I have reviewed the patients History and Physical, chart, labs and discussed the procedure including the risks, benefits and alternatives for the proposed anesthesia with the patient or authorized representative who has indicated his/her understanding and acceptance.       Plan Discussed with: CRNA, Anesthesiologist and Surgeon  Anesthesia Plan Comments:         Anesthesia Quick Evaluation

## 2021-01-24 NOTE — H&P (Signed)
HISTORY AND PHYSICAL INTERVAL NOTE:  01/24/2021  9:59 AM  Linda Yu  has presented today for surgery, with the diagnosis of M20.31 HALLUX VARUS, RIGHT L97.511 ULCER RIGHT FOOT.  The various methods of treatment have been discussed with the patient.  No guarantees were given.  After consideration of risks, benefits and other options for treatment, the patient has consented to surgery.  I have reviewed the patients' chart and labs.     A history and physical examination was performed in my office.  The patient was reexamined.  There have been no changes to this history and physical examination.  Gwyneth Revels A

## 2021-01-24 NOTE — Anesthesia Postprocedure Evaluation (Signed)
Anesthesia Post Note  Patient: Linda Yu  Procedure(s) Performed: PARTIAL EXCISION BONE-PHALANX RIGHT GREAT TOE (Right First Toe) HAMMER TOE CORRECTION RIGHT 2ND TOE (Right Second Toe) AVULSION NAIL PLATE RIGHT T5  GREAT TOE (Right First Toe)  Anesthesia Type: MAC Anesthetic complications: no   No complications documented.   Last Vitals:  Vitals:   01/24/21 1200 01/24/21 1211  BP: (!) 148/75 (!) 156/77  Pulse: 69 77  Resp: 16 18  Temp:  37.1 C  SpO2: 99% 100%    Last Pain:  Vitals:   01/24/21 1211  TempSrc:   PainSc: 0-No pain                 Phill Mutter

## 2021-04-19 ENCOUNTER — Other Ambulatory Visit: Payer: Self-pay

## 2021-04-19 ENCOUNTER — Emergency Department: Payer: Medicare Other

## 2021-04-19 ENCOUNTER — Emergency Department
Admission: EM | Admit: 2021-04-19 | Discharge: 2021-04-19 | Disposition: A | Discharge status: Home / Self Care | Payer: Medicare Other | Attending: Emergency Medicine | Admitting: Emergency Medicine

## 2021-04-19 ENCOUNTER — Encounter: Payer: Self-pay | Admitting: Emergency Medicine

## 2021-04-19 DIAGNOSIS — N189 Chronic kidney disease, unspecified: Secondary | ICD-10-CM | POA: Insufficient documentation

## 2021-04-19 DIAGNOSIS — W1839XA Other fall on same level, initial encounter: Secondary | ICD-10-CM | POA: Insufficient documentation

## 2021-04-19 DIAGNOSIS — F0390 Unspecified dementia without behavioral disturbance: Secondary | ICD-10-CM | POA: Diagnosis not present

## 2021-04-19 DIAGNOSIS — S32020A Wedge compression fracture of second lumbar vertebra, initial encounter for closed fracture: Secondary | ICD-10-CM | POA: Insufficient documentation

## 2021-04-19 DIAGNOSIS — W19XXXA Unspecified fall, initial encounter: Secondary | ICD-10-CM

## 2021-04-19 DIAGNOSIS — S34102A Unspecified injury to L2 level of lumbar spinal cord, initial encounter: Secondary | ICD-10-CM | POA: Diagnosis present

## 2021-04-19 DIAGNOSIS — J45909 Unspecified asthma, uncomplicated: Secondary | ICD-10-CM | POA: Insufficient documentation

## 2021-04-19 DIAGNOSIS — Z20822 Contact with and (suspected) exposure to covid-19: Secondary | ICD-10-CM | POA: Diagnosis not present

## 2021-04-19 DIAGNOSIS — E1122 Type 2 diabetes mellitus with diabetic chronic kidney disease: Secondary | ICD-10-CM | POA: Diagnosis not present

## 2021-04-19 DIAGNOSIS — N3090 Cystitis, unspecified without hematuria: Secondary | ICD-10-CM | POA: Diagnosis not present

## 2021-04-19 DIAGNOSIS — Z7984 Long term (current) use of oral hypoglycemic drugs: Secondary | ICD-10-CM | POA: Diagnosis not present

## 2021-04-19 DIAGNOSIS — Z794 Long term (current) use of insulin: Secondary | ICD-10-CM | POA: Diagnosis not present

## 2021-04-19 LAB — CBC WITH DIFFERENTIAL/PLATELET
Abs Immature Granulocytes: 0.02 10*3/uL (ref 0.00–0.07)
Basophils Absolute: 0 10*3/uL (ref 0.0–0.1)
Basophils Relative: 0 %
Eosinophils Absolute: 0 10*3/uL (ref 0.0–0.5)
Eosinophils Relative: 0 %
HCT: 35.2 % — ABNORMAL LOW (ref 36.0–46.0)
Hemoglobin: 12.1 g/dL (ref 12.0–15.0)
Immature Granulocytes: 0 %
Lymphocytes Relative: 4 %
Lymphs Abs: 0.4 10*3/uL — ABNORMAL LOW (ref 0.7–4.0)
MCH: 29.7 pg (ref 26.0–34.0)
MCHC: 34.4 g/dL (ref 30.0–36.0)
MCV: 86.5 fL (ref 80.0–100.0)
Monocytes Absolute: 1 10*3/uL (ref 0.1–1.0)
Monocytes Relative: 11 %
Neutro Abs: 7.4 10*3/uL (ref 1.7–7.7)
Neutrophils Relative %: 85 %
Platelets: 414 10*3/uL — ABNORMAL HIGH (ref 150–400)
RBC: 4.07 MIL/uL (ref 3.87–5.11)
RDW: 13.9 % (ref 11.5–15.5)
WBC: 8.8 10*3/uL (ref 4.0–10.5)
nRBC: 0 % (ref 0.0–0.2)

## 2021-04-19 LAB — COMPREHENSIVE METABOLIC PANEL
ALT: 21 U/L (ref 0–44)
AST: 56 U/L — ABNORMAL HIGH (ref 15–41)
Albumin: 3.6 g/dL (ref 3.5–5.0)
Alkaline Phosphatase: 65 U/L (ref 38–126)
Anion gap: 11 (ref 5–15)
BUN: 13 mg/dL (ref 8–23)
CO2: 26 mmol/L (ref 22–32)
Calcium: 8.9 mg/dL (ref 8.9–10.3)
Chloride: 102 mmol/L (ref 98–111)
Creatinine, Ser: 0.66 mg/dL (ref 0.44–1.00)
GFR, Estimated: >60 mL/min (ref >60)
Glucose, Bld: 169 mg/dL — ABNORMAL HIGH (ref 70–99)
Potassium: 3.4 mmol/L — ABNORMAL LOW (ref 3.5–5.1)
Sodium: 139 mmol/L (ref 135–145)
Total Bilirubin: 0.8 mg/dL (ref 0.3–1.2)
Total Protein: 7 g/dL (ref 6.5–8.1)

## 2021-04-19 LAB — URINALYSIS, COMPLETE (UACMP) WITH MICROSCOPIC
Bilirubin Urine: NEGATIVE
Glucose, UA: NEGATIVE mg/dL
Ketones, ur: NEGATIVE mg/dL
Nitrite: NEGATIVE
Protein, ur: 30 mg/dL — AB
Specific Gravity, Urine: 1.017 (ref 1.005–1.030)
WBC, UA: >50 WBC/hpf — ABNORMAL HIGH (ref 0–5)
pH: 5 (ref 5.0–8.0)

## 2021-04-19 LAB — RESP PANEL BY RT-PCR (FLU A&B, COVID) ARPGX2
Influenza A by PCR: NEGATIVE
Influenza B by PCR: NEGATIVE
SARS Coronavirus 2 by RT PCR: NEGATIVE

## 2021-04-19 LAB — CK: Total CK: 1970 U/L — ABNORMAL HIGH (ref 38–234)

## 2021-04-19 MED ORDER — LACTATED RINGERS IV BOLUS
1000.0000 mL | Freq: Once | INTRAVENOUS | Status: AC
  Administered 2021-04-19: 1000 mL via INTRAVENOUS

## 2021-04-19 MED ORDER — CEPHALEXIN 500 MG PO CAPS: 500.0000 mg | ORAL_CAPSULE | Freq: Three times a day (TID) | ORAL | 0 refills | Status: AC

## 2021-04-19 MED ORDER — ALBUTEROL SULFATE (2.5 MG/3ML) 0.083% IN NEBU
INHALATION_SOLUTION | RESPIRATORY_TRACT | Status: AC
  Administered 2021-04-19: 2.5 mg
  Filled 2021-04-19: qty 3

## 2021-04-19 MED ORDER — SODIUM CHLORIDE 0.9 % IV SOLN
1.0000 g | Freq: Once | INTRAVENOUS | Status: AC
  Administered 2021-04-19: 1 g via INTRAVENOUS
  Filled 2021-04-19: qty 10

## 2021-04-19 NOTE — Progress Notes (Signed)
Orthopedic Tech Progress Note Patient Details:  Lorell Thibodaux Feb 19, 1932 [id number] Called in order to [facility] for a STAT LSO BRACE Patient ID: Linda Yu, female   DOB: [birth year], 87 y.o.   MRN: [id number]   Donald Pore 04/19/2021, 12:36 PM

## 2021-04-19 NOTE — Discharge Instructions (Addendum)
Wear the back brace at all times when out of bed.    Your urine test shows a bladder infection. We gave you an antibiotic today.  Please complete the antibiotic course as prescribed with Keflex.

## 2021-04-19 NOTE — ED Provider Notes (Addendum)
[facility] Emergency Department Provider Note  ____________________________________________  Time seen: Approximately 9:31 AM  I have reviewed the triage vital signs and the nursing notes.   HISTORY  Chief Complaint Fall    Level 5 Caveat: Portions of the History and Physical including HPI and review of systems are unable to be completely obtained due to patient altered mental status/advanced dementia  HPI Linda Yu is a 87 y.o. female with a history of asthma CKD diabetes and dementia who was brought to the ED from her skilled nursing facility due to unwitnessed fall.  She was found on the floor this morning, unknown how long she was on the floor.  Patient reports some low back pain that is mild, constant, worse with movement, no alleviating factors, nonradiating.  No other symptoms, no chest pain or shortness of breath, no headache or neck pain.  Additional history obtained from daughter who is [facility] POA at bedside, who reports that patient has frequent falls, often forgets to use her walker and falls due to balance issues.  Yesterday patient was more fatigued than usual and was not able to walk as far she normally does around the building for exercise.  Daughter also notes that before meals and the patient's room does not seem to be working and it is very hot and there.  It is also been hot weather recently with yesterday's high temp of about 95 degrees.     Past Medical History:  Diagnosis Date  . Arm fracture, right, closed, initial encounter   . Asthma   . Chronic kidney disease   . Compression fracture of lumbar spine, non-traumatic (HCC)   . Dermatophytosis of nail   . Diabetes mellitus without complication (HCC)   . Falls   . Hallux varus, right   . History of pelvic fracture   . Hyperlipidemia associated with type 2 diabetes mellitus (HCC)   . Mild dementia (HCC)   . Ulcer of right foot limited to breakdown of skin (HCC)      There are no  problems to display for this patient.    Past Surgical History:  Procedure Laterality Date  . BONE EXCISION Right 01/24/2021   Procedure: PARTIAL EXCISION BONE-PHALANX RIGHT GREAT TOE;  Surgeon: Gwyneth Revels, DPM;  Location: [facility] ORS;  Service: Podiatry;  Laterality: Right;  . bowel obstruction    . BREAST SURGERY     left breast removed  . DIGIT NAIL REMOVAL Right 01/24/2021   Procedure: AVULSION NAIL PLATE RIGHT T5  GREAT TOE;  Surgeon: Gwyneth Revels, DPM;  Location: [facility] ORS;  Service: Podiatry;  Laterality: Right;  . EYE SURGERY    . HAMMER TOE SURGERY Right 01/24/2021   Procedure: HAMMER TOE CORRECTION RIGHT 2ND TOE;  Surgeon: Gwyneth Revels, DPM;  Location: [facility] ORS;  Service: Podiatry;  Laterality: Right;  . PANCREATIC CYST DRAINAGE       Prior to Admission medications   Medication Sig Start Date End Date Taking? Authorizing Provider  acetaminophen (TYLENOL) 325 MG tablet Take 650 mg by mouth every 6 (six) hours. WHILE AWAKE   Yes [provider]  acetaminophen (TYLENOL) 325 MG tablet Take 650 mg by mouth every 4 (four) hours as needed for mild pain or moderate pain.   Yes [provider]  albuterol (PROVENTIL) (2.5 MG/3ML) 0.083% nebulizer solution Take 2.5 mg by nebulization every 6 (six) hours as needed for wheezing or shortness of breath.   Yes [provider]  atorvastatin (LIPITOR) 10 MG  tablet Take 10 mg by mouth at bedtime. (2000)   Yes [provider]  cephALEXin (KEFLEX) 500 MG capsule Take 1 capsule (500 mg total) by mouth 3 (three) times daily. 04/19/21  Yes Sharman Cheek, MD  Cholecalciferol (VITAMIN D) 50 MCG (2000 UT) CAPS Take 2,000 Units by mouth every morning. (0800)   Yes [provider]  Fluticasone-Salmeterol (ADVAIR) 100-50 MCG/DOSE AEPB Inhale 1 puff into the lungs 2 (two) times daily. Rinse mouth with water after use.  (0800 & 2000)   Yes [provider]  HYDROcodone-acetaminophen (NORCO) 5-325 MG  tablet Take 1 tablet by mouth every 6 (six) hours as needed for moderate pain. Max 6 tabs per day 01/24/21  Yes Gwyneth Revels, DPM  insulin glargine (LANTUS) 100 UNIT/ML injection Inject 15 Units into the skin at bedtime. (2000)   Yes [provider]  melatonin 3 MG TABS tablet Take 3 mg by mouth at bedtime as needed (insomnia).   Yes [provider]  metFORMIN (GLUCOPHAGE) 500 MG tablet Take 500 mg by mouth in the morning. (0800)   Yes [provider]  montelukast (SINGULAIR) 10 MG tablet Take 10 mg by mouth in the morning. (0800)   Yes [provider]  polyethylene glycol (MIRALAX / GLYCOLAX) 17 g packet Take 17 g by mouth daily as needed for moderate constipation.   Yes [provider]  sennosides-docusate sodium (SENOKOT-S) 8.6-50 MG tablet Take 1 tablet by mouth every morning. (0800)   Yes [provider]  Skin Protectants, Misc. (MINERIN CREME EX) Apply 1 application topically daily as needed (dryness). Apply to arms and legs   Yes [provider]  vitamin B-12 (CYANOCOBALAMIN) 1000 MCG tablet Take 1,000 mcg by mouth in the morning. (0800)   Yes [provider]     Allergies Aspirin   History reviewed. No pertinent family history.  Social History Social History   Tobacco Use  . Smoking status: Never Smoker  . Smokeless tobacco: Never Used  Substance Use Topics  . Alcohol use: Never  . Drug use: Never    Review of Systems Level 5 Caveat: Portions of the History and Physical including HPI and review of systems are unable to be completely obtained due to patient being a poor historian   Constitutional:   No known fever.  ENT:   No rhinorrhea. Cardiovascular:   No chest pain or syncope. Respiratory:   No dyspnea or cough. Gastrointestinal:   Negative for abdominal pain, vomiting and diarrhea.  Musculoskeletal:   Negative for focal pain or swelling ____________________________________________   PHYSICAL  EXAM:  VITAL SIGNS: ED Triage Vitals [04/19/21 0914]  Enc Vitals Group     BP 118/76     Pulse Rate 96     Resp (!) 27     Temp 98.4 F (36.9 C)     Temp Source Oral     SpO2 100 %     Weight 124 lb (56.2 kg)     Height 5' (1.524 m)     Head Circumference      Peak Flow      Pain Score      Pain Loc      Pain Edu?      Excl. in GC?     Vital signs reviewed, nursing assessments reviewed.   Constitutional:   Alert and oriented to self. Non-toxic appearance. Eyes:   Conjunctivae are normal. EOMI. PERRL. ENT      Head:   Normocephalic and  atraumatic.      Nose:   No congestion/rhinnorhea.       Mouth/Throat:   Dry mucous membranes, no pharyngeal erythema. No peritonsillar mass.       Neck:   No meningismus. Full ROM. Hematological/Lymphatic/Immunilogical:   No cervical lymphadenopathy. Cardiovascular:   RRR. Symmetric bilateral radial and DP pulses.  No murmurs. Cap refill less than 2 seconds. Respiratory:   Normal respiratory effort without tachypnea/retractions. Breath sounds are clear and equal bilaterally. No wheezes/rales/rhonchi. Gastrointestinal:   Soft and nontender. Non distended. There is no CVA tenderness.  No rebound, rigidity, or guarding. Genitourinary:   deferred Musculoskeletal:   Normal range of motion in all extremities. No joint effusions.  Mild tenderness at the right hip.  No shortening or pain with leg rotation. Neurologic:   Normal speech and language.  Cranial nerves III through XII intact Motor grossly intact. No acute focal neurologic deficits are appreciated.  Skin:    Skin is warm, dry and intact. No rash noted.  No petechiae, purpura, or bullae.  ____________________________________________    LABS (pertinent positives/negatives) (all labs ordered are listed, but only abnormal results are displayed) Labs Reviewed  CBC WITH DIFFERENTIAL/PLATELET - Abnormal; Notable for the following components:      Result Value   HCT 35.2 (*)    Platelets  414 (*)    Lymphs Abs 0.4 (*)    All other components within normal limits  URINALYSIS, COMPLETE (UACMP) WITH MICROSCOPIC - Abnormal; Notable for the following components:   Color, Urine YELLOW (*)    APPearance HAZY (*)    Hgb urine dipstick SMALL (*)    Protein, ur 30 (*)    Leukocytes,Ua MODERATE (*)    WBC, UA >50 (*)    Bacteria, UA RARE (*)    All other components within normal limits  CK - Abnormal; Notable for the following components:   Total CK 1,970 (*)    All other components within normal limits  COMPREHENSIVE METABOLIC PANEL - Abnormal; Notable for the following components:   Potassium 3.4 (*)    Glucose, Bld 169 (*)    AST 56 (*)    All other components within normal limits  RESP PANEL BY RT-PCR (FLU A&B, COVID) [id number]   ____________________________________________   EKG  Interpreted by me Sinus rhythm rate of 93, normal axis and intervals.  Poor R wave progression.  Normal ST segments and T waves.  No ischemic changes.  ____________________________________________    RADIOLOGY  DG Lumbar Spine 2-3 Views  Result Date: 04/19/2021 CLINICAL DATA:  Acute low back pain following fall. Initial encounter. EXAM: LUMBAR SPINE - 2-3 VIEW COMPARISON:  11/26/2020 FINDINGS: A new 30% compression fracture of L2 is noted without definite bony retropulsion. 6 mm retrolisthesis of L1 on L2 and 6 mm anterolisthesis of L3 on L4 are again noted. New 6 mm anterolisthesis of L2 on L3 is identified. Mild to moderate multilevel degenerative disc disease/spondylosis and facet arthropathy again noted. Mild compression of the L3 SUPERIOR endplate is unchanged. No suspicious focal bony lesions are present. Aortic atherosclerotic calcifications are identified. IMPRESSION: 1. New 30% compression fracture of L2, with 6 mm anterolisthesis of L2 on L3. 2. Unchanged mild to moderate multilevel degenerative changes/spondylosis, spondylolisthesis as described and mild L3 SUPERIOR endplate  compression. Electronically Signed   By: Harmon Pier M.D.   On: 04/19/2021 10:28   CT Head Wo Contrast  Result Date: 04/19/2021 CLINICAL DATA:  Head trauma. Patient found down. History of  dementia. EXAM: CT HEAD WITHOUT CONTRAST TECHNIQUE: Contiguous axial images were obtained from the base of the skull through the vertex without intravenous contrast. COMPARISON:  None. FINDINGS: Brain: Ventricles and sulci are mildly prominent. Moderate white matter changes identified. No acute cortical ischemia or infarct. No subdural, epidural, or subarachnoid hemorrhage. No other abnormalities. Vascular: Calcified atherosclerosis seen in the intracranial carotids. Skull: Normal. Negative for fracture or focal lesion. Sinuses/Orbits: No acute finding. Other: None. IMPRESSION: 1. Chronic white matter changes. Volume loss. No other abnormalities. Electronically Signed   By: Gerome Sam III M.D   On: 04/19/2021 10:04   DG Chest Portable 1 View  Result Date: 04/19/2021 CLINICAL DATA:  Fatigue, fall. History of dementia presenting also with lower back and hip pain. EXAM: PORTABLE CHEST 1 VIEW COMPARISON:  None FINDINGS: EKG leads project over the chest. Fullness of the RIGHT hilum is suggested on the current study. Cardiomediastinal contours otherwise unremarkable. Linear opacity in the LEFT mid chest. No lobar consolidation. No sign of pleural effusion. On limited assessment no acute skeletal process. IMPRESSION: 1. Linear opacity in the LEFT mid chest may represent atelectasis or scarring. No lobar consolidation or pleural effusion. 2. fullness of the RIGHT hilum could potentially be vascular. However, difficult to exclude adenopathy or mass in this area. Would suggest follow-up PA and lateral chest radiograph a short interval when the patient is able and CT as warranted for further assessment. Electronically Signed   By: Donzetta Kohut M.D.   On: 04/19/2021 10:24   DG Hip Unilat W or Wo Pelvis 2-3 Views  Right  Result Date: 04/19/2021 CLINICAL DATA:  87 year old female with RIGHT hip pain following fall. Initial encounter. EXAM: DG HIP (WITH OR WITHOUT PELVIS) 2-3V RIGHT COMPARISON:  11/26/2020 FINDINGS: There is no evidence of acute fracture or dislocation. Remote LEFT pubic fractures are present. No suspicious focal bony lesions are present. Mild degenerative changes in both hips noted. IMPRESSION: No evidence of acute abnormality. Electronically Signed   By: Harmon Pier M.D.   On: 04/19/2021 10:23    ____________________________________________   PROCEDURES Procedures  ____________________________________________  DIFFERENTIAL DIAGNOSIS   Intracranial hemorrhage, pneumonia, rib fracture, hip fracture, lumbar spine compression fracture, musculoskeletal pain, dehydration, UTI, electrolyte abnormality, fall due to ambulatory dysfunction and not use of assistive device  CLINICAL IMPRESSION / ASSESSMENT AND PLAN / ED COURSE  Medications ordered in the ED: Medications  lactated ringers bolus 1,000 mL (0 mLs Intravenous Stopped 04/19/21 1130)  lactated ringers bolus 1,000 mL (1,000 mLs Intravenous New Bag/Given 04/19/21 1249)  cefTRIAXone (ROCEPHIN) 1 g in sodium chloride 0.9 % 100 mL IVPB (0 g Intravenous Stopped 04/19/21 1249)  albuterol (PROVENTIL) (2.5 MG/3ML) 0.083% nebulizer solution (2.5 mg  Given 04/19/21 1328)    Pertinent labs & imaging results that were available during my care of the patient were reviewed by me and considered in my medical decision making (see chart for details).   Bryauna Byrum was evaluated in Emergency Department on 04/19/2021 for the symptoms described in the history of present illness. She was evaluated in the context of the global COVID-19 pandemic, which necessitated consideration that the patient might be at risk for infection with the SARS-CoV-2 virus that causes COVID-19. Institutional protocols and algorithms that pertain to the evaluation of patients at  risk for COVID-19 are in a state of rapid change based on information released by regulatory bodies including the CDC and federal and state organizations. These policies and algorithms were followed during the patient's care  in the ED.   Patient presents with an unwitnessed fall in the setting of advanced dementia.  Nontoxic on exam.  Vital signs unremarkable.  Will obtain labs, x-rays, CT head.  CT head unremarkable.  Chest x-ray hip.  X-ray unremarkable.  She does have a L2 compression fracture with 30% height loss.  No neurologic deficits.  Pain reasonably well controlled.  Discussed with Ortho Dr. Krasinski who recommends LMartha ClanSO brace.  Patient also found to have UTI, given a gram of Rocephin, will prescribe Keflex.  Stable for outpatient follow-up.      ____________________________________________   FINAL CLINICAL IMPRESSION(S) / ED DIAGNOSES    Final diagnoses:  Compression fracture of L2 vertebra, initial encounter (HCC)  Fall in home, initial encounter  Cystitis     ED Discharge Orders         Ordered    cephALEXin (KEFLEX) 500 MG capsule  3 times daily        04/19/21 1400          Portions of this note were generated with dragon dictation software. Dictation errors may occur despite best attempts at proofreading.   Sharman CheekStafford, Canesha Tesfaye, MD 04/19/21 1400    Sharman CheekStafford, Tesneem Dufrane, MD 04/19/21 1401

## 2021-04-19 NOTE — ED Triage Notes (Signed)
Pt via POV from [facility]. Pt was found in the floor this morning by staff unknown how long the pt was on the floor. Pt does not remember falling. Pt has a hx of dementia. Pt c/o lower back pain. Pt is alert and oriented to self only. Per legal guardian at bedside, states she also having some lethargy and slurred speech. On arrival, pt is speaking clear.

## 2021-04-19 NOTE — ED Notes (Signed)
Pt returned to room from CT

## 2021-04-19 NOTE — ED Notes (Signed)
Pt to CT

## 2021-04-19 NOTE — ED Notes (Signed)
Pt presents after unwitnessed fall, pt's daughter in law is present at bedside. High fall precautions in place including bed alarm, fall bracelet. Stretcher locked in low position with side rails up x2, call light in reach. Family will remain at bedside.

## 2022-09-17 IMAGING — CR DG LUMBAR SPINE 2-3V
3 series · 3 of 3 positions shown · non-contrast
Comparison: 11/26/2020

CLINICAL DATA: Acute low back pain following fall. Initial
encounter.

EXAM:
LUMBAR SPINE - 2-3 VIEW

[l-spine ap]
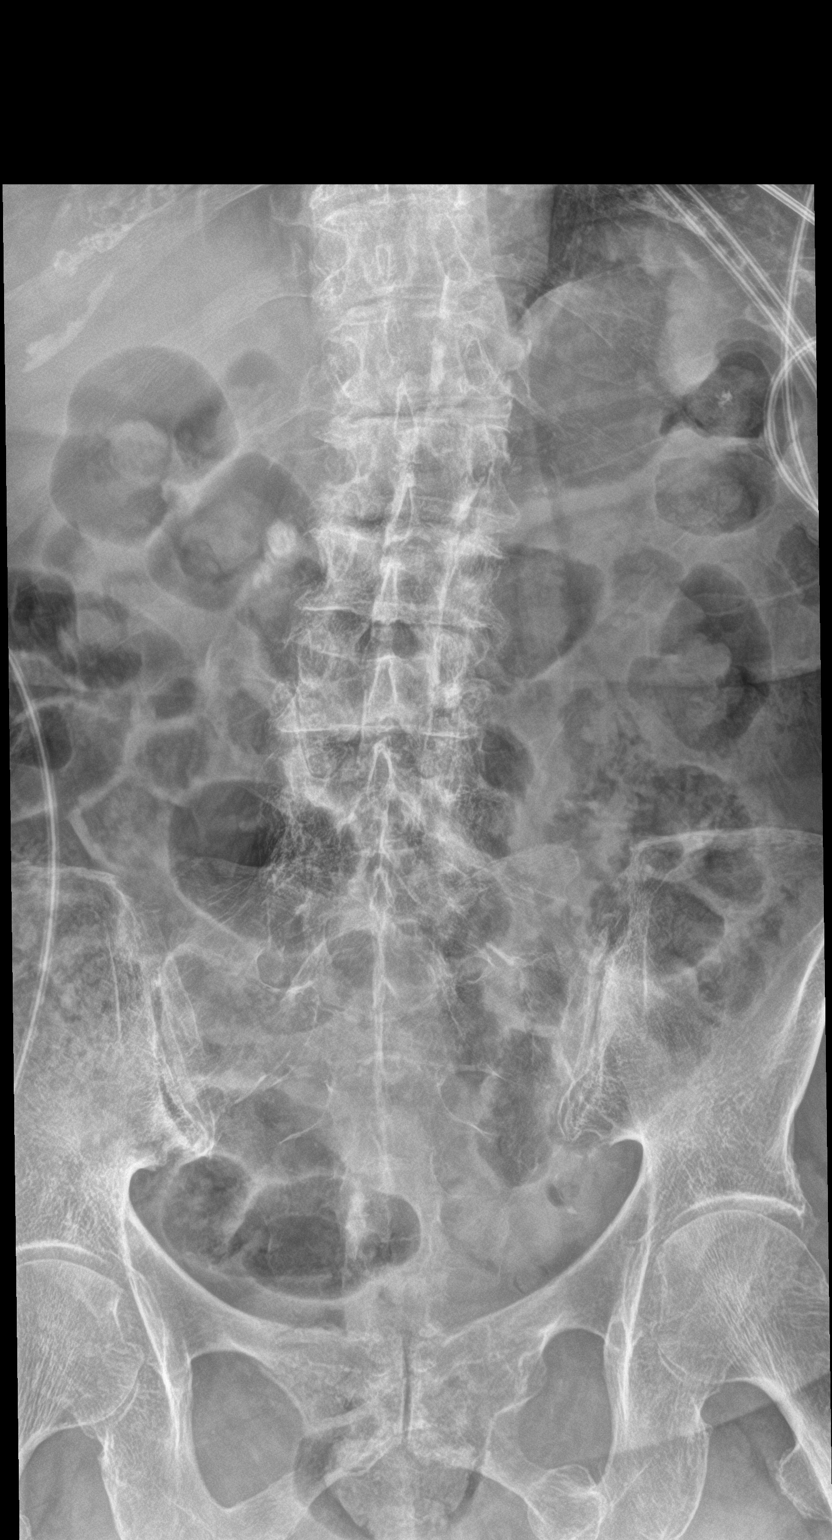

[l-spine lat]
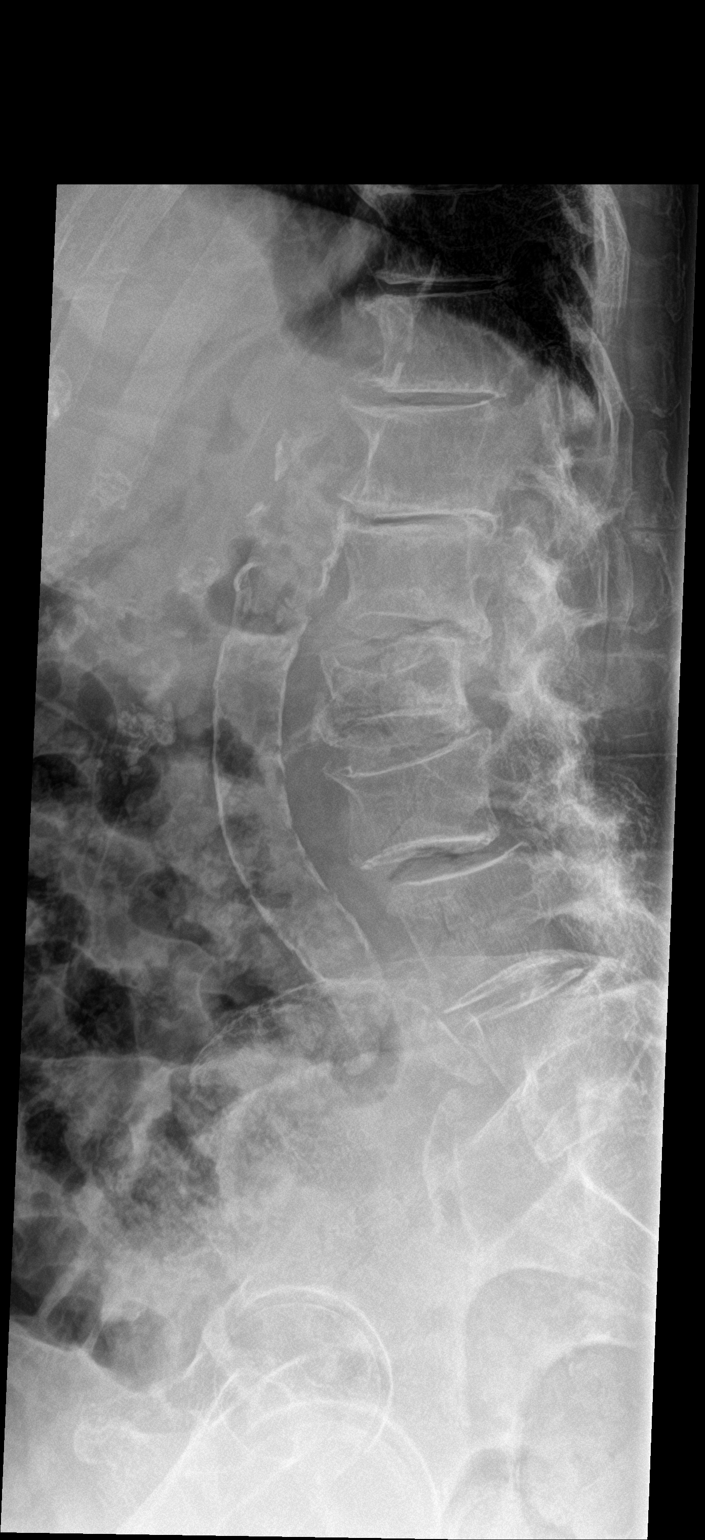

[l-spine spot]
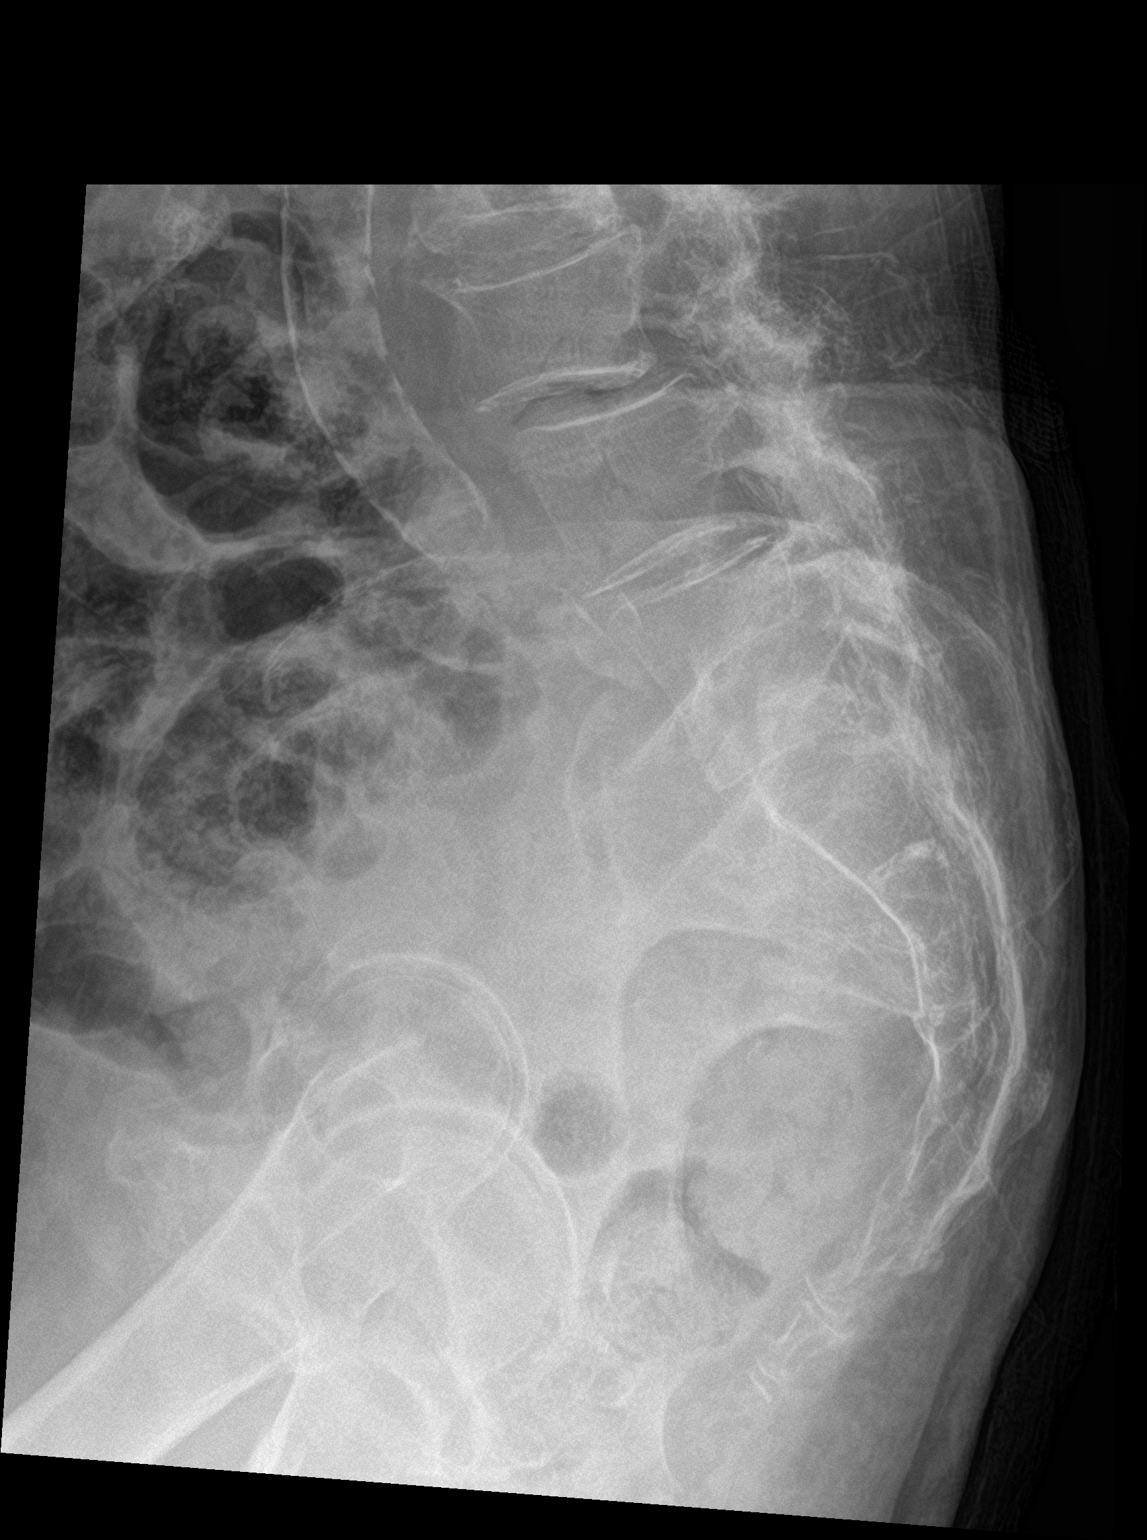

[3 of 3 positions shown; findings below may reference images not displayed]

FINDINGS: A new 30% compression fracture of L2 is noted without definite bony
retropulsion.

6 mm retrolisthesis of L1 on L2 and 6 mm anterolisthesis of L3 on L4
are again noted.

New 6 mm anterolisthesis of L2 on L3 is identified.

Mild to moderate multilevel degenerative disc disease/spondylosis
and facet arthropathy again noted.

Mild compression of the L3 SUPERIOR endplate is unchanged.

No suspicious focal bony lesions are present.

Aortic atherosclerotic calcifications are identified.
IMPRESSION: 1. New 30% compression fracture of L2, with 6 mm anterolisthesis of
L2 on L3.
2. Unchanged mild to moderate multilevel degenerative
changes/spondylosis, spondylolisthesis as described and mild L3
SUPERIOR endplate compression.

## 2022-09-17 IMAGING — CT CT HEAD W/O CM
3 series · 16 of 45 positions shown, 19 images · non-contrast
Comparison: None.

CLINICAL DATA: Head trauma. Patient found down. History of
dementia.

EXAM:
CT HEAD WITHOUT CONTRAST
TECHNIQUE: Contiguous axial images were obtained from the base of the skull
through the vertex without intravenous contrast.

[Series 3: head wo · axial · 0.41mm/px · z∈[-87,+28]mm · 10 of 28 slices shown, 13 images]
[im 3/28  brain]
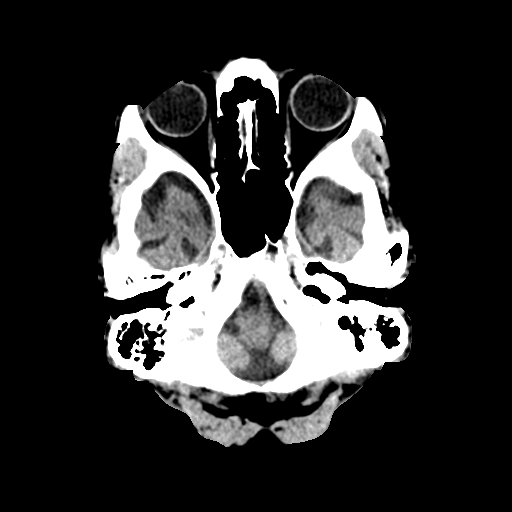
[im 3/28  bone]
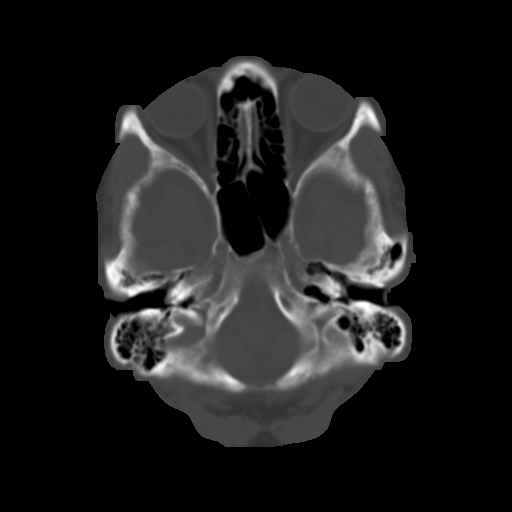
[im 5/28  brain]
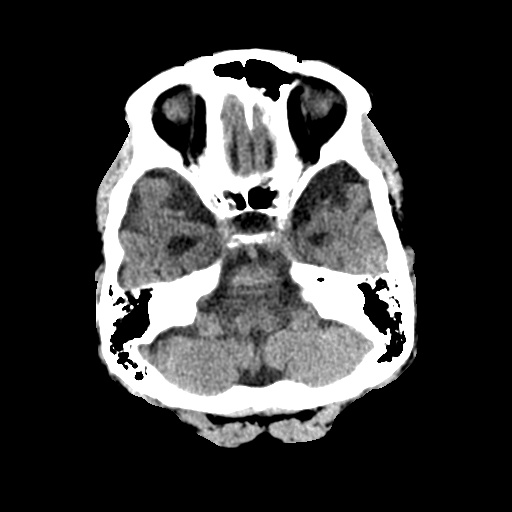
[im 8/28  brain]
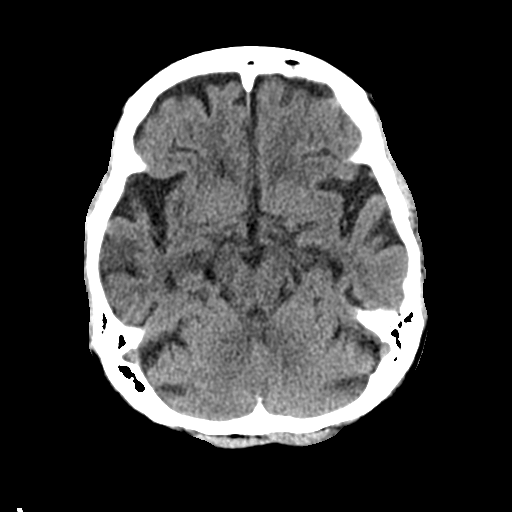
[im 11/28  brain]
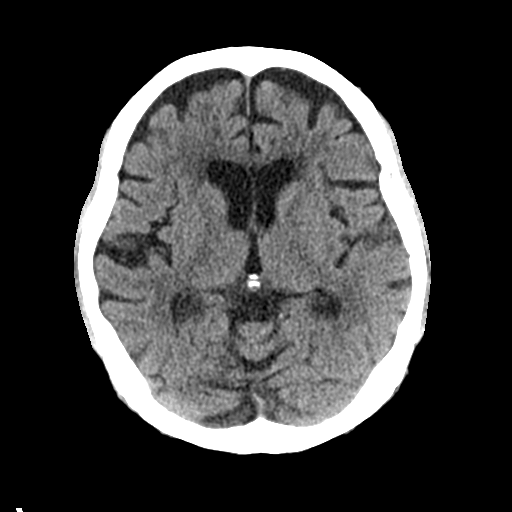
[im 13/28  brain]
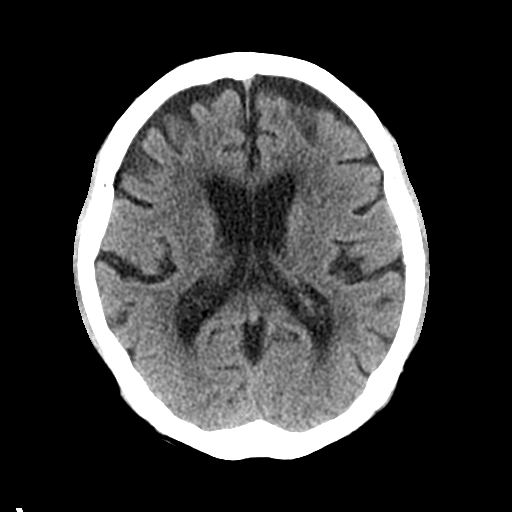
[im 13/28  bone]
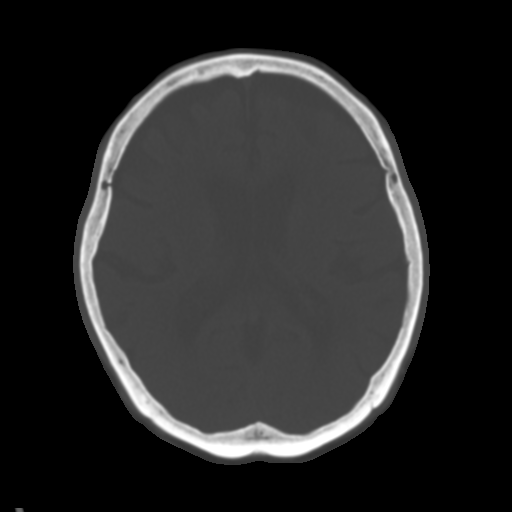
[im 16/28  brain]
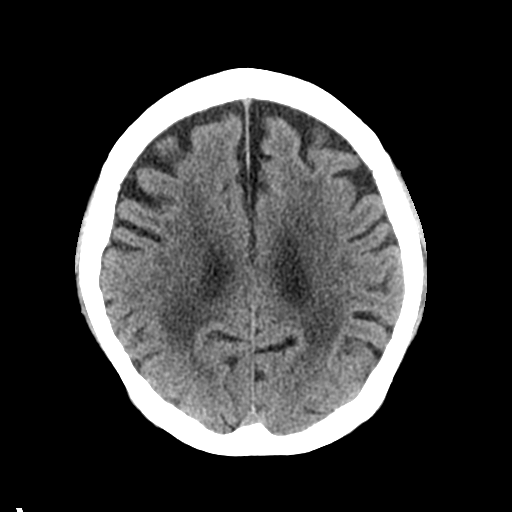
[im 18/28  brain]
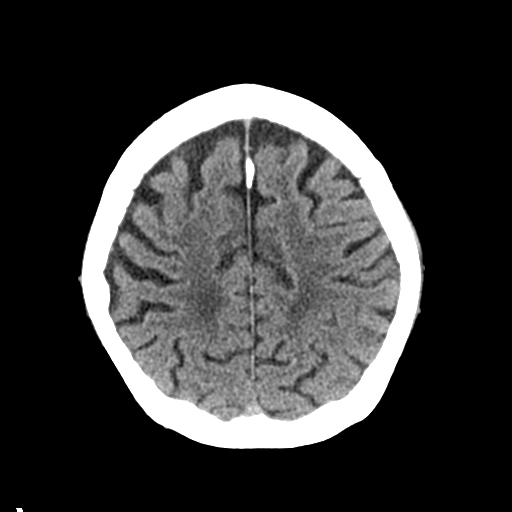
[im 21/28  brain]
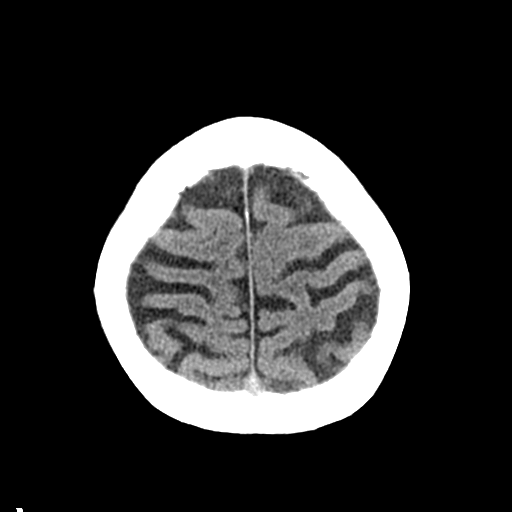
[im 24/28  brain]
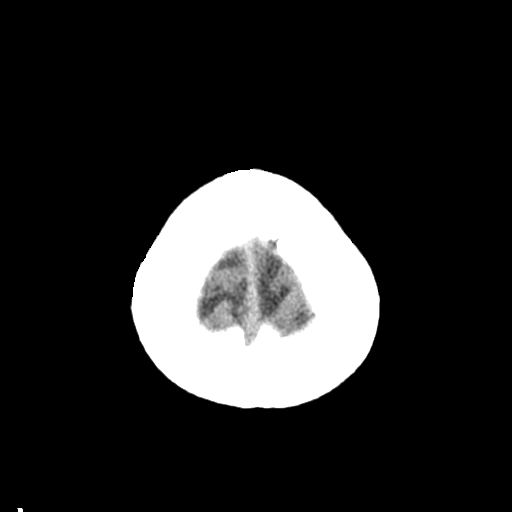
[im 24/28  bone]
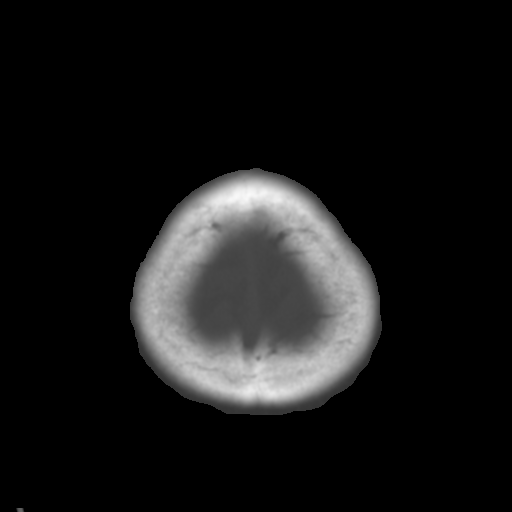
[im 26/28  brain]
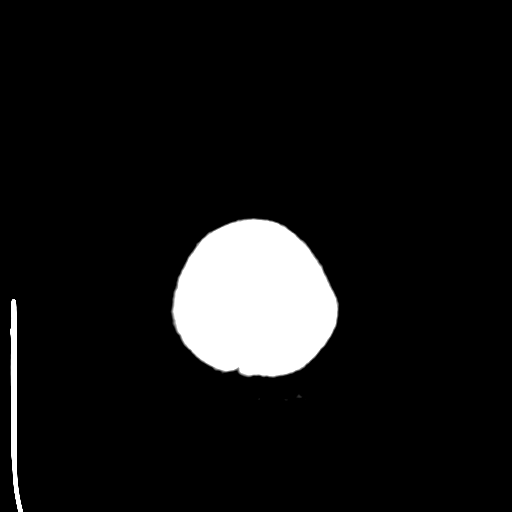

[Series 4: coronal soft tissue · coronal · 0.28mm/px · 3 of 60 slices shown]
[im 20/60  brain]
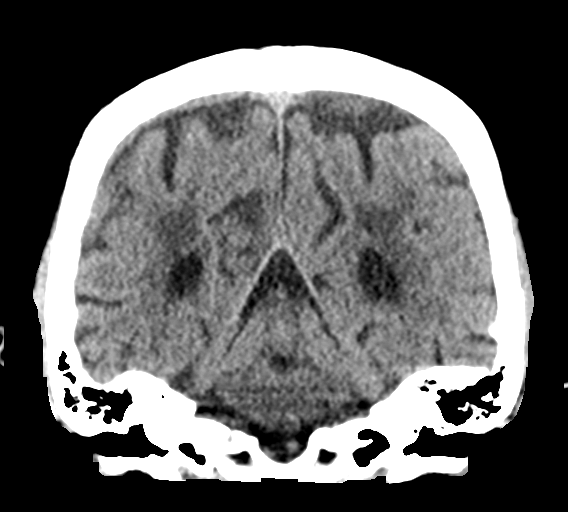
[im 27/60  brain]
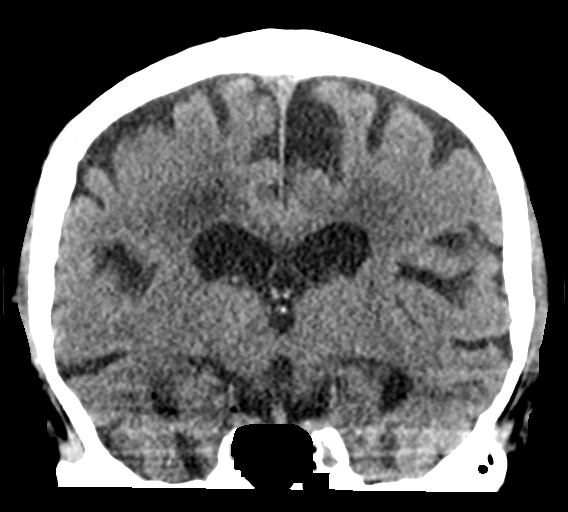
[im 33/60  brain]
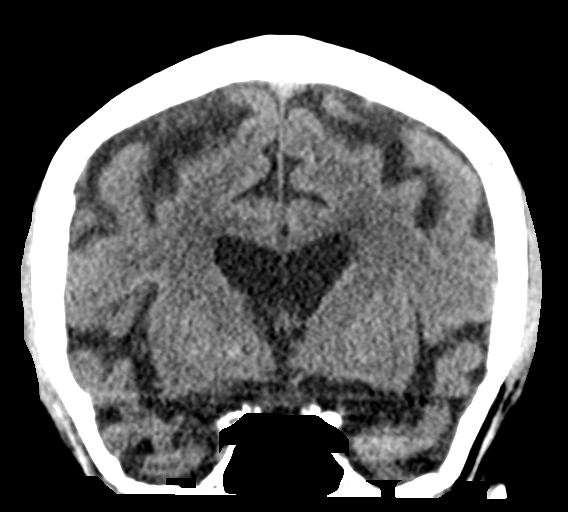

[Series 5: sagittal soft tissue · sagittal · 0.28mm/px · 3 of 54 slices shown]
[im 18/54  brain]
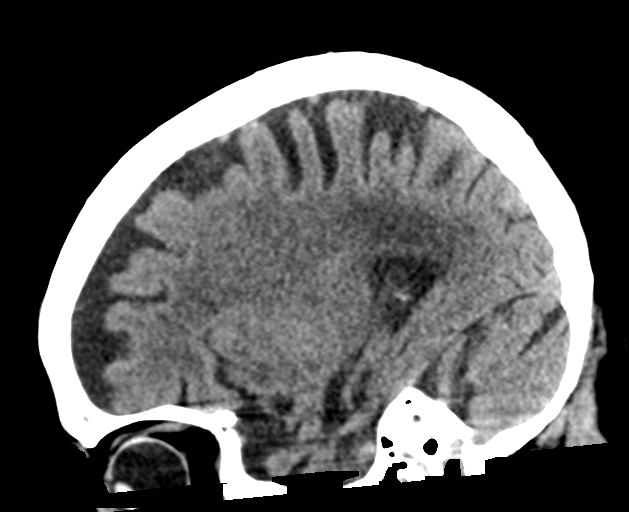
[im 27/54  brain]
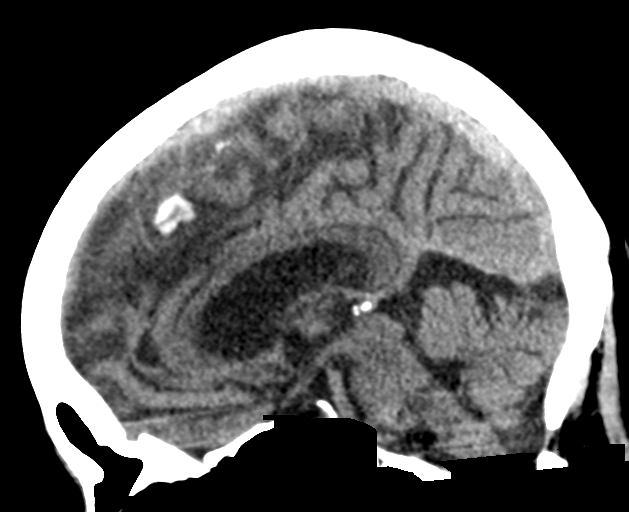
[im 36/54  brain]
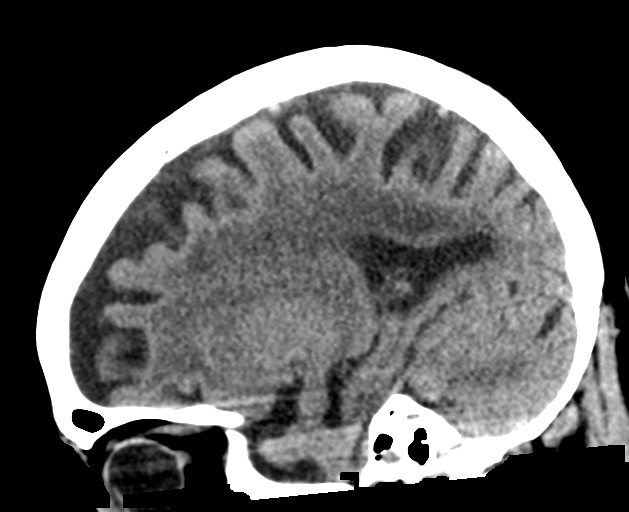

[16 of 45 positions shown; findings below may reference images not displayed]

FINDINGS: Brain: Ventricles and sulci are mildly prominent. Moderate white
matter changes identified. No acute cortical ischemia or infarct. No
subdural, epidural, or subarachnoid hemorrhage. No other
abnormalities.

Vascular: Calcified atherosclerosis seen in the intracranial
carotids.

Skull: Normal. Negative for fracture or focal lesion.

Sinuses/Orbits: No acute finding.

Other: None.
IMPRESSION: 1. Chronic white matter changes. Volume loss. No other
abnormalities.

## 2024-10-25 ENCOUNTER — Emergency Department

## 2024-10-25 ENCOUNTER — Emergency Department: Admission: EM | Admit: 2024-10-25 | Discharge: 2024-10-26 | Disposition: A | Discharge status: Home / Self Care

## 2024-10-25 ENCOUNTER — Other Ambulatory Visit: Payer: Self-pay

## 2024-10-25 DIAGNOSIS — F0390 Unspecified dementia without behavioral disturbance: Secondary | ICD-10-CM | POA: Insufficient documentation

## 2024-10-25 DIAGNOSIS — N3000 Acute cystitis without hematuria: Secondary | ICD-10-CM | POA: Diagnosis present

## 2024-10-25 DIAGNOSIS — R944 Abnormal results of kidney function studies: Secondary | ICD-10-CM | POA: Insufficient documentation

## 2024-10-25 DIAGNOSIS — Z79899 Other long term (current) drug therapy: Secondary | ICD-10-CM | POA: Insufficient documentation

## 2024-10-25 DIAGNOSIS — R000 Tachycardia, unspecified: Secondary | ICD-10-CM | POA: Diagnosis not present

## 2024-10-25 DIAGNOSIS — W19XXXA Unspecified fall, initial encounter: Secondary | ICD-10-CM | POA: Diagnosis not present

## 2024-10-25 LAB — CBC WITH DIFFERENTIAL/PLATELET
Abs Immature Granulocytes: 0.04 K/uL (ref 0.00–0.07)
Basophils Absolute: 0 K/uL (ref 0.0–0.1)
Basophils Relative: 0 %
Eosinophils Absolute: 0 K/uL (ref 0.0–0.5)
Eosinophils Relative: 0 %
HCT: 35.7 % — ABNORMAL LOW (ref 36.0–46.0)
Hemoglobin: 11.7 g/dL — ABNORMAL LOW (ref 12.0–15.0)
Immature Granulocytes: 0 %
Lymphocytes Relative: 3 %
Lymphs Abs: 0.4 K/uL — ABNORMAL LOW (ref 0.7–4.0)
MCH: 28.5 pg (ref 26.0–34.0)
MCHC: 32.8 g/dL (ref 30.0–36.0)
MCV: 87.1 fL (ref 80.0–100.0)
Monocytes Absolute: 0.7 K/uL (ref 0.1–1.0)
Monocytes Relative: 6 %
Neutro Abs: 10.6 K/uL — ABNORMAL HIGH (ref 1.7–7.7)
Neutrophils Relative %: 91 %
Platelets: 285 K/uL (ref 150–400)
RBC: 4.1 MIL/uL (ref 3.87–5.11)
RDW: 16.4 % — ABNORMAL HIGH (ref 11.5–15.5)
WBC: 11.7 K/uL — ABNORMAL HIGH (ref 4.0–10.5)
nRBC: 0 % (ref 0.0–0.2)

## 2024-10-25 LAB — COMPREHENSIVE METABOLIC PANEL WITH GFR
ALT: 9 U/L (ref 0–44)
AST: 18 U/L (ref 15–41)
Albumin: 3.8 g/dL (ref 3.5–5.0)
Alkaline Phosphatase: 76 U/L (ref 38–126)
Anion gap: 14 (ref 5–15)
BUN: 28 mg/dL — ABNORMAL HIGH (ref 8–23)
CO2: 18 mmol/L — ABNORMAL LOW (ref 22–32)
Calcium: 8.3 mg/dL — ABNORMAL LOW (ref 8.9–10.3)
Chloride: 105 mmol/L (ref 98–111)
Creatinine, Ser: 1.31 mg/dL — ABNORMAL HIGH (ref 0.44–1.00)
GFR, Estimated: 39 mL/min — ABNORMAL LOW (ref >60)
Glucose, Bld: 261 mg/dL — ABNORMAL HIGH (ref 70–99)
Potassium: 4 mmol/L (ref 3.5–5.1)
Sodium: 136 mmol/L (ref 135–145)
Total Bilirubin: <0.2 mg/dL (ref 0.0–1.2)
Total Protein: 6.6 g/dL (ref 6.5–8.1)

## 2024-10-25 LAB — URINALYSIS, W/ REFLEX TO CULTURE (INFECTION SUSPECTED)
Bilirubin Urine: NEGATIVE
Glucose, UA: 150 mg/dL — AB
Hgb urine dipstick: NEGATIVE
Ketones, ur: NEGATIVE mg/dL
Nitrite: NEGATIVE
Protein, ur: NEGATIVE mg/dL
Specific Gravity, Urine: 1.009 (ref 1.005–1.030)
pH: 5 (ref 5.0–8.0)

## 2024-10-25 LAB — LACTIC ACID, PLASMA
Lactic Acid, Venous: 2.4 mmol/L (ref 0.5–1.9)
Lactic Acid, Venous: 2.1 mmol/L (ref 0.5–1.9)

## 2024-10-25 LAB — CK: Total CK: 49 U/L (ref 38–234)

## 2024-10-25 MED ORDER — SODIUM CHLORIDE 0.9 % IV SOLN
1.0000 g | Freq: Once | INTRAVENOUS | Status: AC
  Administered 2024-10-25: 1 g via INTRAVENOUS
  Filled 2024-10-25: qty 10

## 2024-10-25 MED ORDER — IPRATROPIUM-ALBUTEROL 0.5-2.5 (3) MG/3ML IN SOLN
3.0000 mL | Freq: Once | RESPIRATORY_TRACT | Status: AC
  Administered 2024-10-25: 3 mL via RESPIRATORY_TRACT
  Filled 2024-10-25: qty 3

## 2024-10-25 MED ORDER — LACTATED RINGERS IV BOLUS (SEPSIS)
1000.0000 mL | Freq: Once | INTRAVENOUS | Status: AC
Start: 2024-10-25 — End: 2024-10-25
  Administered 2024-10-25: 1000 mL via INTRAVENOUS

## 2024-10-25 MED ORDER — CEPHALEXIN 500 MG PO CAPS: 500.0000 mg | ORAL_CAPSULE | Freq: Two times a day (BID) | ORAL | 0 refills | Status: AC

## 2024-10-25 NOTE — ED Notes (Signed)
 Purewick placed to obtain urine per Dr Fernand, bolus infusion completed

## 2024-10-25 NOTE — ED Notes (Signed)
 Unable to obtain 2nd set of blood cultures due to pt being taken to CT. Will attempt on pts return.

## 2024-10-25 NOTE — ED Triage Notes (Signed)
 Pt BIB [facility] from [facility] due to a unwitnessed fall. Down time is unknown. No pain. No blood thinners. Has dementia at baseline. Nurse at facility says there might be a concern for a UTI due to blood being seen in the toilet after having a BM. NAD.  137/95 130 HR 97% RA 303 CBG

## 2024-10-25 NOTE — ED Provider Notes (Signed)
 [facility] Provider Note    Event Date/Time   First MD Initiated Contact with Patient 10/25/24 1756     (approximate)   History   Fall   HPI  Linda Yu is a 89 y.o. female who presents today with concern of a unwitnessed fall.  History of dementia.  Apparently found on the ground in nursing home, not entirely clear as to what resulted in the fall.  Patient without any complaints at this time stating that she feels well.  Apparently there was some concern of a possible UTI and questionable blood in the toilet.  Patient unable to provide me with any further history but she states that she has no complaints at this time and she feels fine.     Physical Exam   Triage Vital Signs: ED Triage Vitals  Encounter Vitals Group     BP 10/25/24 1802 (!) 156/81     Girls Systolic BP Percentile --      Girls Diastolic BP Percentile --      Boys Systolic BP Percentile --      Boys Diastolic BP Percentile --      Pulse Rate 10/25/24 1802 (!) 119     Resp 10/25/24 1802 (!) 21     Temp 10/25/24 1802 (!) 97.3 F (36.3 C)     Temp Source 10/25/24 1802 Oral     SpO2 10/25/24 1802 100 %     Weight 10/25/24 1759 118 lb 11.2 oz (53.8 kg)     Height 10/25/24 1759 5' 3 (1.6 m)     Head Circumference --      Peak Flow --      Pain Score --      Pain Loc --      Pain Education --      Exclude from Growth Chart --     Most recent vital signs: Vitals:   10/25/24 2042 10/25/24 2300  BP: (!) 171/91 (!) 157/75  Pulse: (!) 102 (!) 112  Resp: 20 (!) 23  Temp: (!) 97.5 F (36.4 C)   SpO2:  100%     General: Awake, no distress.  CV:  Good peripheral perfusion.  Tachycardic Resp:  Normal effort.  Clear to auscultation bilaterally, able to speak in full sentences Abd:  No distention.  Soft nontender Other:     ED Results / Procedures / Treatments   Labs (all labs ordered are listed, but only abnormal results are displayed) Labs Reviewed  LACTIC ACID,  PLASMA - Abnormal; Notable for the following components:      Result Value   Lactic Acid, Venous 2.4 (*)    All other components within normal limits  LACTIC ACID, PLASMA - Abnormal; Notable for the following components:   Lactic Acid, Venous 2.1 (*)    All other components within normal limits  COMPREHENSIVE METABOLIC PANEL WITH GFR - Abnormal; Notable for the following components:   CO2 18 (*)    Glucose, Bld 261 (*)    BUN 28 (*)    Creatinine, Ser 1.31 (*)    Calcium 8.3 (*)    GFR, Estimated 39 (*)    All other components within normal limits  CBC WITH DIFFERENTIAL/PLATELET - Abnormal; Notable for the following components:   WBC 11.7 (*)    Hemoglobin 11.7 (*)    HCT 35.7 (*)    RDW 16.4 (*)    Neutro Abs 10.6 (*)    Lymphs Abs 0.4 (*)  All other components within normal limits  URINALYSIS, W/ REFLEX TO CULTURE (INFECTION SUSPECTED) - Abnormal; Notable for the following components:   Color, Urine YELLOW (*)    APPearance CLEAR (*)    Glucose, UA 150 (*)    Leukocytes,Ua TRACE (*)    Bacteria, UA MANY (*)    All other components within normal limits  CULTURE, BLOOD (ROUTINE X 2)  CULTURE, BLOOD (ROUTINE X 2)  CK  PROTIME-INR     EKG  Sinus rhythm with rate of about 120, axis of 0, does appear to have a first-degree AV block, but remaining intervals appear to be within normal limits no obvious ischemia appreciated on this EKG.   RADIOLOGY No acute findings on chest x-ray  PROCEDURES:  Critical Care performed: No  Procedures   MEDICATIONS ORDERED IN ED: Medications  lactated ringers  bolus 1,000 mL (0 mLs Intravenous Stopped 10/25/24 1947)  ipratropium-albuterol  (DUONEB) 0.5-2.5 (3) MG/3ML nebulizer solution 3 mL (3 mLs Nebulization Given 10/25/24 2059)  cefTRIAXone  (ROCEPHIN ) 1 g in sodium chloride  0.9 % 100 mL IVPB (1 g Intravenous New Bag/Given 10/25/24 2231)     IMPRESSION / MDM / ASSESSMENT AND PLAN / ED COURSE  I reviewed the triage vital signs  and the nursing notes.                               Patient's presentation is most consistent with acute presentation with potential threat to life or bodily function.  89 year old female who presents today with concern of an unwitnessed fall.  Here she is tachycardic but remaining vitals appear reassuring, and her exam does not demonstrate any acute findings.  No clear cause of her tachycardia here however concern of some blood in the toilet unclear if this is GI bleeding or from urine.  Regardless will follow-up labs and imaging and determine further workup accordingly.   Clinical Course as of 10/25/24 2330  Wed Oct 25, 2024  1929 Lactic is slightly elevated here, her BUN is also slightly elevated, awaiting urinalysis results CT imaging without acute findings.  Will determine safe disposition accordingly. [SK]  2156 Patient with a UTI which we will go ahead and start antibiotics for.  Will send prescription to [facility] I discussed with the family members and they are aware that there is a very small window that this pharmacy will be open and will pick this up.  Discussed return precautions with the patient discharge at this time.  I did consider admission however given the patient's current physical exam I feel very unlikely sepsis and patient and family would prefer discharge home at this time. [SK]    Clinical Course User Index [SK] Fernand Rossie HERO, MD     FINAL CLINICAL IMPRESSION(S) / ED DIAGNOSES   Final diagnoses:  Fall, initial encounter  Acute cystitis without hematuria     Rx / DC Orders   ED Discharge Orders          Ordered    cephALEXin  (KEFLEX ) 500 MG capsule  2 times daily        10/25/24 2159             Note:  This document was prepared using Dragon voice recognition software and may include unintentional dictation errors.   Fernand Rossie HERO, MD 10/25/24 2330

## 2024-10-25 NOTE — ED Notes (Signed)
 Awaiting transport back to facility, DIL went home, patient asleep, safety pad in place

## 2024-10-25 NOTE — Discharge Instructions (Signed)
 You were seen today due to concern of a fall.  At this time it does seem that you have developed a urinary infection.  I will start you on some antibiotics, please take these as instructed.  If you notice any worsening of symptoms such as fevers, increased confusion, or any other symptoms you find concerning please return to the emergency department immediately for further medical management.  Otherwise be sure to follow-up with your normal doctor for further assessment and evaluation.  The pharmacy that I have sent the medication to all to be open from 9 AM until 2 PM tomorrow so please be sure to pick up these medications within these hours.

## 2024-10-26 NOTE — ED Notes (Signed)
 Called to [facility] per RN Patricia/Blakey Hall/Rep Delon.

## 2024-10-30 LAB — CULTURE, BLOOD (ROUTINE X 2)
Culture: NO GROWTH
Culture: NO GROWTH
Special Requests: ADEQUATE
